# Patient Record
Sex: Female | Born: 1980
Health system: Southern US, Community
[De-identification: ages and names within clinical notes are randomized; demographics above are authoritative.]

## PROBLEM LIST (undated history)

## (undated) DIAGNOSIS — Z720 Tobacco use: Secondary | ICD-10-CM

## (undated) DIAGNOSIS — R911 Solitary pulmonary nodule: Secondary | ICD-10-CM

## (undated) DIAGNOSIS — F419 Anxiety disorder, unspecified: Secondary | ICD-10-CM

## (undated) DIAGNOSIS — F32A Depression, unspecified: Secondary | ICD-10-CM

## (undated) DIAGNOSIS — Z87442 Personal history of urinary calculi: Secondary | ICD-10-CM

## (undated) DIAGNOSIS — E785 Hyperlipidemia, unspecified: Secondary | ICD-10-CM

## (undated) DIAGNOSIS — E039 Hypothyroidism, unspecified: Secondary | ICD-10-CM

## (undated) DIAGNOSIS — F329 Major depressive disorder, single episode, unspecified: Secondary | ICD-10-CM

## (undated) DIAGNOSIS — J189 Pneumonia, unspecified organism: Secondary | ICD-10-CM

## (undated) HISTORY — PX: HAND SURGERY: SHX662

---
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
## 2000-01-25 ENCOUNTER — Other Ambulatory Visit: Admission: RE | Admit: 2000-01-25 | Discharge: 2000-01-25 | Payer: Self-pay | Admitting: Family Medicine

## 2000-03-17 ENCOUNTER — Emergency Department (HOSPITAL_COMMUNITY): Admission: EM | Admit: 2000-03-17 | Discharge: 2000-03-17 | Payer: Self-pay | Admitting: Emergency Medicine

## 2005-04-16 ENCOUNTER — Other Ambulatory Visit: Admission: RE | Admit: 2005-04-16 | Discharge: 2005-04-16 | Payer: Self-pay | Admitting: Obstetrics and Gynecology

## 2005-04-17 ENCOUNTER — Inpatient Hospital Stay (HOSPITAL_COMMUNITY): Admission: AD | Admit: 2005-04-17 | Discharge: 2005-04-17 | Payer: Self-pay | Admitting: Obstetrics & Gynecology

## 2005-11-12 ENCOUNTER — Encounter (INDEPENDENT_AMBULATORY_CARE_PROVIDER_SITE_OTHER): Payer: Self-pay | Admitting: Specialist

## 2005-11-12 ENCOUNTER — Inpatient Hospital Stay (HOSPITAL_COMMUNITY): Admission: RE | Admit: 2005-11-12 | Discharge: 2005-11-14 | Payer: Self-pay | Admitting: Obstetrics and Gynecology

## 2005-11-12 HISTORY — PX: TUBAL LIGATION: SHX77

## 2007-02-17 ENCOUNTER — Encounter: Admission: RE | Admit: 2007-02-17 | Discharge: 2007-02-17 | Payer: Self-pay | Admitting: *Deleted

## 2011-02-22 NOTE — Discharge Summary (Signed)
[name], Tasha Hoover             ACCOUNT NO.:  [id number]   MEDICAL RECORD NO.:  [id number]          PATIENT TYPE:  INP   LOCATION:  9122                          FACILITY:  [facility]   PHYSICIAN:  Randye Lobo, M.D.   DATE OF BIRTH:  [birth year]   DATE OF ADMISSION:  11/12/2005  DATE OF DISCHARGE:  11/14/2005                                 DISCHARGE SUMMARY   FINAL DIAGNOSES:  1.  Intrauterine pregnancy at term.  2.  History of previous cesarean section.  3.  Desires elective repeat cesarean section.   PROCEDURE:  Repeat low flap transverse cesarean section and tubal ligation  for permanent sterilization. Surgeon:  Dr. Miguel Aschoff. Assistant:  Dr. Lodema Hong. Complications:  None.   This 30 year old G2, P1-0-0-1 presents at [redacted] weeks gestation for repeat  cesarean section. The patient had a previous cesarean section and now elects  to repeat a cesarean section as well as a tubal sterilization. The patient's  antepartum course up to this point had been complicated just by her smoking,  which she was counseled on throughout her pregnancy. She did slow down but  never completely quit using cigarettes. The patient did have a negative  group B strep culture obtained in the office at 35 weeks. She was admitted  at this time. She was taken to the operating room by Dr. Miguel Aschoff where a  repeat low transverse cesarean section was performed with the delivery of a  6-pound 14-ounce female infant with Apgars of 9 and 9. There was a nuchal cord  x1. The procedure went without complications. The patient still expressed  her desires for permanent sterilization. This was performed without  complications. The patient's postoperative course was benign without any  significant fevers. She was felt ready for discharge on postoperative day  #2. She was sent home on a regular diet, told to decrease her activity, told  to continue her prenatal vitamins, was given Percocet one to two every 4  hours as  needed for pain, told she could use over-the-counter ibuprofen up  to 600 mg every 6 hours as needed for pain, was to follow up in the office  in 4 weeks.   LABORATORY ON DISCHARGE:  The patient had a hemoglobin of 11.8; white blood  cell count of 16.4; platelets of 279,000; and a normal chemistry panel.      Leilani Able, P.A.-C.      Randye Lobo, M.D.  Electronically Signed    MB/MEDQ  D:  12/15/2005  T:  12/16/2005  Job:  [id number]

## 2011-02-22 NOTE — Op Note (Signed)
Tasha Hoover, Tasha Hoover             ACCOUNT NO.:  [id number]   MEDICAL RECORD NO.:  [id number]          PATIENT TYPE:  INP   LOCATION:  9122                          FACILITY:  [facility]   PHYSICIAN:  Miguel Aschoff, M.D.       DATE OF BIRTH:  May 13, 1981   DATE OF PROCEDURE:  11/13/2005  DATE OF DISCHARGE:                                 OPERATIVE REPORT   PREOPERATIVE DIAGNOSES:  1.  Intrauterine pregnancy at term.  2.  Elective repeat cesarean section.   POSTOPERATIVE DIAGNOSES:  1.  Intrauterine pregnancy at term.  2.  Elective repeat cesarean section.  3.  Delivery of a viable female infant, Apgars 9 and 9.   OPERATION/PROCEDURE:  Repeat low flap transverse cesarean section.   SURGEON:  Miguel Aschoff, M.D.   ASSISTANT:  Luvenia Redden, M.D.   ANESTHESIA:  Spinal.   COMPLICATIONS:  None.   JUSTIFICATION:  The patient is a 30 year old white female, gravida 2, para 1-  0-0-1 at 31 weeks who had a previous cesarean section.  She now elected to  undergo elective repeat cesarean section as well as tubal sterilization.  The risks and benefits of the procedure were discussed with the patient and  informed consent has been obtained.   DESCRIPTION OF PROCEDURE:  The patient was taken to the operating room,  placed in a sitting position and spinal anesthesia was administered without  difficulty.  She was then placed in a supine position, deviated to the left  and prepped and draped in the usual sterile fashion.  A Foley catheter was  inserted.  After being prepped and draped, the Pfannenstiel incision was  made, extended down through the subcutaneous tissue with bleeding points  being clamped and coagulated as they were encountered.  The fascia was then  identified and incised transversely and separated from the underlying rectus  muscles.  Rectus muscles were divided in the midline.  The peritoneum was  then found and entered carefully avoiding underlying structures.  Peritoneal  incision was  extended under direct visualization.  At this point the bladder  flap was created and protected with a bladder blade.  An elliptical  transverse incision was made into the lower uterine segment.  The amniotic  cavity was entered.  Clear fluid was obtained and at this point the patient  was delivered of a viable female infant, Apgars 9 at one minute and 9 at five  minutes. The nuchal cord x1 was noted.  The baby was delivered from an LOA  position.  After delivery of the baby, cord bloods were obtained for  appropriate studies.  The placenta was delivered without difficulty.  The  uterus was then evacuated of any remaining products of conception.  Once  this was done, the angles of the uterine incision were ligated using figure-  of-eight sutures of #1 Vicryl.  The first layer was a running interlocking  suture of #1 Vicryl followed by an imbricating suture of #1 Vicryl.  Bladder  flap was reapproximated using running continuous 2-0 Vicryl suture.   With the patient requesting sterilization, attention was then  directed to  the left tube which was grasped with the Babcock clamp.  A knuckle of the  tube was thus created and this knuckle of tube was ligated with two  ligatures of 0 plain gut.  A portion of the tube without ligatures was  excised.  Tubal stumps were cauterized.  The identical procedure was then  carried out on the right side.   After this was done with adequate hemostasis, lap counts and instrument  counts were found to be correct.  The abdomen was irrigated with warm saline  and then the abdomen was closed.  Parietal peritoneum was closed using  running continuous  0 Vicryl suture.  Rectus muscles were reapproximated  using running continuous 0 Vicryl suture.  Fascia was closed using two  sutures of 0 Vicryl, each starting at the lateral fascial angles and meeting  in the midline. Subcutaneous tissue and skin were closed using staples.  The  estimated blood loss during the  procedure was approximately 500 mL.  The  patient tolerated the procedure well and went to the recovery room in  satisfactory condition.  The baby was taken to the nursery in satisfactory  condition.      Miguel Aschoff, M.D.  Electronically Signed     AR/MEDQ  D:  11/13/2005  T:  11/14/2005  Job:  [id number]

## 2013-11-24 ENCOUNTER — Other Ambulatory Visit: Payer: Self-pay | Admitting: Obstetrics & Gynecology

## 2013-11-24 DIAGNOSIS — N63 Unspecified lump in unspecified breast: Secondary | ICD-10-CM

## 2013-11-25 ENCOUNTER — Other Ambulatory Visit: Payer: Self-pay

## 2013-12-06 ENCOUNTER — Ambulatory Visit
Admission: RE | Admit: 2013-12-06 | Discharge: 2013-12-06 | Disposition: A | Discharge status: Home / Self Care | Payer: 59 | Source: Ambulatory Visit | Attending: Obstetrics & Gynecology | Admitting: Obstetrics & Gynecology

## 2013-12-06 DIAGNOSIS — N63 Unspecified lump in unspecified breast: Secondary | ICD-10-CM

## 2015-04-08 ENCOUNTER — Inpatient Hospital Stay (HOSPITAL_COMMUNITY)
Admission: EM | Admit: 2015-04-08 | Discharge: 2015-04-10 | DRG: 605 | Disposition: A | Discharge status: Home / Self Care | Payer: 59 | Attending: Internal Medicine | Admitting: Internal Medicine

## 2015-04-08 ENCOUNTER — Encounter (HOSPITAL_COMMUNITY): Payer: Self-pay | Admitting: Emergency Medicine

## 2015-04-08 ENCOUNTER — Emergency Department (HOSPITAL_COMMUNITY): Payer: 59

## 2015-04-08 DIAGNOSIS — S80212A Abrasion, left knee, initial encounter: Secondary | ICD-10-CM | POA: Diagnosis present

## 2015-04-08 DIAGNOSIS — S2232XA Fracture of one rib, left side, initial encounter for closed fracture: Secondary | ICD-10-CM | POA: Diagnosis not present

## 2015-04-08 DIAGNOSIS — S2242XA Multiple fractures of ribs, left side, initial encounter for closed fracture: Secondary | ICD-10-CM | POA: Diagnosis present

## 2015-04-08 DIAGNOSIS — S36030A Superficial (capsular) laceration of spleen, initial encounter: Secondary | ICD-10-CM | POA: Diagnosis present

## 2015-04-08 DIAGNOSIS — S0101XA Laceration without foreign body of scalp, initial encounter: Principal | ICD-10-CM | POA: Diagnosis present

## 2015-04-08 DIAGNOSIS — S9000XA Contusion of unspecified ankle, initial encounter: Secondary | ICD-10-CM | POA: Diagnosis present

## 2015-04-08 DIAGNOSIS — S3600XA Unspecified injury of spleen, initial encounter: Secondary | ICD-10-CM | POA: Diagnosis present

## 2015-04-08 DIAGNOSIS — D62 Acute posthemorrhagic anemia: Secondary | ICD-10-CM | POA: Diagnosis present

## 2015-04-08 DIAGNOSIS — S2249XA Multiple fractures of ribs, unspecified side, initial encounter for closed fracture: Secondary | ICD-10-CM | POA: Diagnosis present

## 2015-04-08 LAB — URINALYSIS, ROUTINE W REFLEX MICROSCOPIC
BILIRUBIN URINE: NEGATIVE
GLUCOSE, UA: NEGATIVE mg/dL
Ketones, ur: NEGATIVE mg/dL
LEUKOCYTES UA: NEGATIVE
NITRITE: NEGATIVE
PH: 6 (ref 5.0–8.0)
Protein, ur: 100 mg/dL — AB
Specific Gravity, Urine: 1.03 (ref 1.005–1.030)
Urobilinogen, UA: 0.2 mg/dL (ref 0.0–1.0)

## 2015-04-08 LAB — COMPREHENSIVE METABOLIC PANEL
ALT: 15 U/L (ref 14–54)
ANION GAP: 10 (ref 5–15)
AST: 25 U/L (ref 15–41)
Albumin: 3.9 g/dL (ref 3.5–5.0)
Alkaline Phosphatase: 42 U/L (ref 38–126)
BILIRUBIN TOTAL: 0.5 mg/dL (ref 0.3–1.2)
BUN: 13 mg/dL (ref 6–20)
CALCIUM: 9.3 mg/dL (ref 8.9–10.3)
CO2: 23 mmol/L (ref 22–32)
CREATININE: 1.19 mg/dL — AB (ref 0.44–1.00)
Chloride: 107 mmol/L (ref 101–111)
GFR calc Af Amer: >60 mL/min (ref >60)
GFR calc non Af Amer: 58 mL/min — ABNORMAL LOW (ref >60)
Glucose, Bld: 150 mg/dL — ABNORMAL HIGH (ref 65–99)
POTASSIUM: 3.4 mmol/L — AB (ref 3.5–5.1)
SODIUM: 140 mmol/L (ref 135–145)
TOTAL PROTEIN: 6.8 g/dL (ref 6.5–8.1)

## 2015-04-08 LAB — URINE MICROSCOPIC-ADD ON

## 2015-04-08 LAB — CBC WITH DIFFERENTIAL/PLATELET
Basophils Absolute: 0 10*3/uL (ref 0.0–0.1)
Basophils Relative: 0 % (ref 0–1)
EOS ABS: 0.4 10*3/uL (ref 0.0–0.7)
EOS PCT: 3 % (ref 0–5)
HEMATOCRIT: 40.7 % (ref 36.0–46.0)
Hemoglobin: 13.8 g/dL (ref 12.0–15.0)
Lymphocytes Relative: 42 % (ref 12–46)
Lymphs Abs: 5.2 10*3/uL — ABNORMAL HIGH (ref 0.7–4.0)
MCH: 33.1 pg (ref 26.0–34.0)
MCHC: 33.9 g/dL (ref 30.0–36.0)
MCV: 97.6 fL (ref 78.0–100.0)
MONOS PCT: 8 % (ref 3–12)
Monocytes Absolute: 1 10*3/uL (ref 0.1–1.0)
NEUTROS ABS: 5.7 10*3/uL (ref 1.7–7.7)
Neutrophils Relative %: 47 % (ref 43–77)
Platelets: 301 10*3/uL (ref 150–400)
RBC: 4.17 MIL/uL (ref 3.87–5.11)
RDW: 13.4 % (ref 11.5–15.5)
WBC: 12.3 10*3/uL — AB (ref 4.0–10.5)

## 2015-04-08 LAB — POC URINE PREG, ED: PREG TEST UR: NEGATIVE

## 2015-04-08 MED ORDER — IBUPROFEN 800 MG PO TABS
800.0000 mg | ORAL_TABLET | Freq: Once | ORAL | Status: AC
  Administered 2015-04-08: 800 mg via ORAL
  Filled 2015-04-08: qty 1
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
## 2015-04-08 NOTE — ED Notes (Signed)
Taken to xray at this time. 

## 2015-04-09 ENCOUNTER — Emergency Department (HOSPITAL_COMMUNITY): Payer: 59

## 2015-04-09 ENCOUNTER — Encounter (HOSPITAL_COMMUNITY): Payer: Self-pay | Admitting: Radiology

## 2015-04-09 DIAGNOSIS — D62 Acute posthemorrhagic anemia: Secondary | ICD-10-CM | POA: Diagnosis present

## 2015-04-09 DIAGNOSIS — S2232XA Fracture of one rib, left side, initial encounter for closed fracture: Secondary | ICD-10-CM | POA: Diagnosis present

## 2015-04-09 DIAGNOSIS — S3600XA Unspecified injury of spleen, initial encounter: Secondary | ICD-10-CM

## 2015-04-09 DIAGNOSIS — S36030A Superficial (capsular) laceration of spleen, initial encounter: Secondary | ICD-10-CM | POA: Diagnosis present

## 2015-04-09 DIAGNOSIS — S80212A Abrasion, left knee, initial encounter: Secondary | ICD-10-CM | POA: Diagnosis present

## 2015-04-09 DIAGNOSIS — S0101XA Laceration without foreign body of scalp, initial encounter: Secondary | ICD-10-CM | POA: Diagnosis present

## 2015-04-09 DIAGNOSIS — S2242XA Multiple fractures of ribs, left side, initial encounter for closed fracture: Secondary | ICD-10-CM | POA: Diagnosis present

## 2015-04-09 HISTORY — DX: Unspecified injury of spleen, initial encounter: S36.00XA

## 2015-04-09 LAB — CBC
HCT: 33.3 % — ABNORMAL LOW (ref 36.0–46.0)
HEMATOCRIT: 33.4 % — AB (ref 36.0–46.0)
HEMOGLOBIN: 11.6 g/dL — AB (ref 12.0–15.0)
Hemoglobin: 11.5 g/dL — ABNORMAL LOW (ref 12.0–15.0)
MCH: 33 pg (ref 26.0–34.0)
MCH: 33.6 pg (ref 26.0–34.0)
MCHC: 34.7 g/dL (ref 30.0–36.0)
MCHC: 34.5 g/dL (ref 30.0–36.0)
MCV: 94.9 fL (ref 78.0–100.0)
MCV: 97.4 fL (ref 78.0–100.0)
PLATELETS: UNDETERMINED 10*3/uL (ref 150–400)
Platelets: 148 10*3/uL — ABNORMAL LOW (ref 150–400)
RBC: 3.52 MIL/uL — ABNORMAL LOW (ref 3.87–5.11)
RBC: 3.42 MIL/uL — ABNORMAL LOW (ref 3.87–5.11)
RDW: 13.6 % (ref 11.5–15.5)
RDW: 13.7 % (ref 11.5–15.5)
WBC: 8.4 10*3/uL (ref 4.0–10.5)
WBC: 8.2 10*3/uL (ref 4.0–10.5)

## 2015-04-09 LAB — BASIC METABOLIC PANEL
ANION GAP: 7 (ref 5–15)
BUN: 9 mg/dL (ref 6–20)
CALCIUM: 8.2 mg/dL — AB (ref 8.9–10.3)
CHLORIDE: 115 mmol/L — AB (ref 101–111)
CO2: 16 mmol/L — AB (ref 22–32)
CREATININE: 0.71 mg/dL (ref 0.44–1.00)
GFR calc Af Amer: >60 mL/min (ref >60)
GFR calc non Af Amer: >60 mL/min (ref >60)
GLUCOSE: 83 mg/dL (ref 65–99)
POTASSIUM: 5 mmol/L (ref 3.5–5.1)
Sodium: 138 mmol/L (ref 135–145)

## 2015-04-09 MED ORDER — IOHEXOL 300 MG/ML  SOLN
100.0000 mL | Freq: Once | INTRAMUSCULAR | Status: AC | PRN
  Administered 2015-04-09: 100 mL via INTRAVENOUS

## 2015-04-09 MED ORDER — OXYCODONE HCL 5 MG PO TABS
10.0000 mg | ORAL_TABLET | ORAL | Status: DC | PRN
  Administered 2015-04-09 – 2015-04-10 (×5): 10 mg via ORAL
  Filled 2015-04-09 (×5): qty 2

## 2015-04-09 MED ORDER — TRAMADOL HCL 50 MG PO TABS
100.0000 mg | ORAL_TABLET | Freq: Two times a day (BID) | ORAL | Status: DC | PRN
  Administered 2015-04-10: 100 mg via ORAL
  Filled 2015-04-09: qty 2

## 2015-04-09 MED ORDER — PANTOPRAZOLE SODIUM 40 MG PO TBEC
40.0000 mg | DELAYED_RELEASE_TABLET | Freq: Every day | ORAL | Status: DC
  Administered 2015-04-10: 40 mg via ORAL
  Filled 2015-04-09: qty 1

## 2015-04-09 MED ORDER — CETYLPYRIDINIUM CHLORIDE 0.05 % MT LIQD
7.0000 mL | Freq: Two times a day (BID) | OROMUCOSAL | Status: DC
  Administered 2015-04-09 – 2015-04-10 (×2): 7 mL via OROMUCOSAL

## 2015-04-09 MED ORDER — ONDANSETRON HCL 4 MG/2ML IJ SOLN: 4.0000 mg | Freq: Four times a day (QID) | INTRAMUSCULAR | Status: DC | PRN

## 2015-04-09 MED ORDER — MORPHINE SULFATE 2 MG/ML IJ SOLN
2.0000 mg | Freq: Once | INTRAMUSCULAR | Status: AC
  Administered 2015-04-09: 2 mg via INTRAVENOUS
  Filled 2015-04-09: qty 1

## 2015-04-09 MED ORDER — IOHEXOL 300 MG/ML  SOLN
75.0000 mL | Freq: Once | INTRAMUSCULAR | Status: AC | PRN
  Administered 2015-04-09: 75 mL via INTRAVENOUS

## 2015-04-09 MED ORDER — NORETHIN ACE-ETH ESTRAD-FE 1.5-30 MG-MCG PO TABS: 1.0000 | ORAL_TABLET | Freq: Every day | ORAL | Status: DC

## 2015-04-09 MED ORDER — SODIUM CHLORIDE 0.9 % IV SOLN
INTRAVENOUS | Status: DC
  Administered 2015-04-09 (×2): via INTRAVENOUS

## 2015-04-09 MED ORDER — HYDROMORPHONE HCL 1 MG/ML IJ SOLN
1.0000 mg | INTRAMUSCULAR | Status: DC | PRN
  Administered 2015-04-09 (×2): 1 mg via INTRAVENOUS
  Filled 2015-04-09 (×3): qty 1

## 2015-04-09 MED ORDER — WHITE PETROLATUM GEL
Status: AC
  Administered 2015-04-09: 1
  Filled 2015-04-09: qty 1

## 2015-04-09 MED ORDER — SODIUM CHLORIDE 0.9 % IV BOLUS (SEPSIS)
1000.0000 mL | Freq: Once | INTRAVENOUS | Status: AC
  Administered 2015-04-09: 1000 mL via INTRAVENOUS

## 2015-04-09 MED ORDER — PANTOPRAZOLE SODIUM 40 MG IV SOLR
40.0000 mg | Freq: Every day | INTRAVENOUS | Status: DC
  Administered 2015-04-09: 40 mg via INTRAVENOUS
  Filled 2015-04-09: qty 40

## 2015-04-09 MED ORDER — ONDANSETRON HCL 4 MG PO TABS: 4.0000 mg | ORAL_TABLET | Freq: Four times a day (QID) | ORAL | Status: DC | PRN
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
## 2015-04-10 DIAGNOSIS — S2249XA Multiple fractures of ribs, unspecified side, initial encounter for closed fracture: Secondary | ICD-10-CM | POA: Diagnosis present

## 2015-04-10 DIAGNOSIS — S80212A Abrasion, left knee, initial encounter: Secondary | ICD-10-CM | POA: Diagnosis present

## 2015-04-10 DIAGNOSIS — S9000XA Contusion of unspecified ankle, initial encounter: Secondary | ICD-10-CM | POA: Diagnosis present

## 2015-04-10 DIAGNOSIS — S0101XA Laceration without foreign body of scalp, initial encounter: Secondary | ICD-10-CM | POA: Diagnosis present

## 2015-04-10 LAB — BASIC METABOLIC PANEL
ANION GAP: 5 (ref 5–15)
CALCIUM: 7.9 mg/dL — AB (ref 8.9–10.3)
CHLORIDE: 110 mmol/L (ref 101–111)
CO2: 20 mmol/L — ABNORMAL LOW (ref 22–32)
CREATININE: 0.66 mg/dL (ref 0.44–1.00)
GFR calc Af Amer: >60 mL/min (ref >60)
GFR calc non Af Amer: >60 mL/min (ref >60)
Glucose, Bld: 108 mg/dL — ABNORMAL HIGH (ref 65–99)
Potassium: 3.8 mmol/L (ref 3.5–5.1)
Sodium: 135 mmol/L (ref 135–145)

## 2015-04-10 LAB — CBC
HEMATOCRIT: 34.1 % — AB (ref 36.0–46.0)
HEMOGLOBIN: 11.3 g/dL — AB (ref 12.0–15.0)
MCH: 32.9 pg (ref 26.0–34.0)
MCHC: 33.1 g/dL (ref 30.0–36.0)
MCV: 99.4 fL (ref 78.0–100.0)
Platelets: 160 10*3/uL (ref 150–400)
RBC: 3.43 MIL/uL — AB (ref 3.87–5.11)
RDW: 13.5 % (ref 11.5–15.5)
WBC: 7.6 10*3/uL (ref 4.0–10.5)

## 2015-04-10 MED ORDER — TRAMADOL HCL 50 MG PO TABS: 100.0000 mg | ORAL_TABLET | Freq: Two times a day (BID) | ORAL | Status: DC | PRN

## 2015-04-10 MED ORDER — DOCUSATE SODIUM 100 MG PO CAPS: 100.0000 mg | ORAL_CAPSULE | Freq: Two times a day (BID) | ORAL | Status: DC | PRN

## 2015-04-10 MED ORDER — POLYETHYLENE GLYCOL 3350 17 G PO PACK: 17.0000 g | PACK | Freq: Every day | ORAL | Status: DC | PRN

## 2015-04-10 MED ORDER — OXYCODONE HCL 10 MG PO TABS: 10.0000 mg | ORAL_TABLET | Freq: Four times a day (QID) | ORAL | Status: DC | PRN

## 2015-04-10 MED ORDER — DOCUSATE SODIUM 100 MG PO CAPS
100.0000 mg | ORAL_CAPSULE | Freq: Two times a day (BID) | ORAL | Status: DC
  Administered 2015-04-10: 100 mg via ORAL
  Filled 2015-04-10: qty 1

## 2015-04-10 MED ORDER — POLYETHYLENE GLYCOL 3350 17 G PO PACK
17.0000 g | PACK | Freq: Every day | ORAL | Status: DC | PRN
  Administered 2015-04-10: 17 g via ORAL
  Filled 2015-04-10: qty 1
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
## 2015-04-10 NOTE — Progress Notes (Signed)
Orthopedic Tech Progress Note Patient Details:  Tasha Antiguaamela Hoover [birth year] [id number]  Ortho Devices Type of Ortho Device: Crutches Ortho Device/Splint Interventions: Application   Tasha Hoover 04/10/2015, 2:06 PM

## 2015-04-10 NOTE — Progress Notes (Signed)
Orthopedic Tech Progress Note Patient Details:  Tasha Antiguaamela Yanni Oct 19, 1980 [id number]  Patient ID: Tasha Hoover, female   DOB: Oct 19, 1980, 34 y.o.   MRN: [id number] Viewed order from doctor's order list  [name], [name] 04/10/2015, 2:06 PM
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
## 2015-04-10 NOTE — Discharge Instructions (Signed)
ESTABLISH CARE WITH A PRIMARY CARE PROVIDER AND CONSIDER GETTING REPEAT RIGHT FOOT XRAYS IF NOT IMPROVING.  MAY NEED REFERRAL FROM PRIMARY DOCTOR TO ORTHO. WEIGHT BEARING AS TOLERATED ON RIGHT FOOT, USE ICE, ELEVATION, COMPRESSION WITH ACE WRAP  NO HIGH IMPACT EXERCISE OR CONTACT SPORTS TO PREVENT FURTHER DAMAGE TO YOUR SPLEEN  Splenic Injury A splenic injury is an injury to the spleen. The spleen is an organ located in the upper left side of the abdomen, just under the ribs. The spleen filters and cleans the blood. It also stores blood cells and destroys cells that are worn out. The spleen is involved in fighting disease. However, when the spleen is removed, your body can still fight most diseases without it.  CAUSES  A blow to the abdomen can injure the spleen. In some cases, the spleen may only be bruised with some bleeding inside the covering and around the spleen. However, sometimes an injury to the spleen causes it to break (rupture). Illness can also cause the spleen to become enlarged and rupture. Because the spleen has a lot of blood going to it, a rupture is very serious and can be life-threatening. SYMPTOMS  Often, a minor splenic injury causes no symptoms or only minor abdominal pain. When bleeding from the injury is severe, the patient's blood pressure may rapidly decrease. This may cause some of the following problems:  Dizziness or lightheadedness.  Rapid heart rate.  Abdominal tenderness and swelling.  Fainting.  Sweating with clammy skin. DIAGNOSIS  Your caregiver may immediately know what is wrong by taking a history and doing a physical exam. If there is time, the diagnosis is often confirmed by a CT scan. Other imaging tests may also be done, such as an ultrasound exam or sometimes an MRI scan. Lab tests may be done to check your blood and may be needed frequently for a couple days after the injury.  TREATMENT   If the blood pressure is too low, emergency surgery may be  needed.  When injuries are less severe, your surgeon may decide to observe the injury, or to treat the injury with interventional radiology.Interventional radiology uses flexible tubes (catheters) to stop the bleeding from inside the blood vessel. It can only be used under certain circumstances. Your caregiver will tell you if this is an option.  One to several days in an intensive care unit (ICU) may be required. Fluid and blood levels will be monitored closely.Intravenous (IV) fluids and blood transfusions are sometimes needed. Follow-up scans may be used to check how the spleen is healing. The spleen may be able to heal itself. However, if the problems are getting worse, surgery may still be needed. HOME CARE INSTRUCTIONS  Keep all follow-up appointments as instructed by your caregiver. Even when the spleen appears to have healed well, your surgeon may want to continue following up on the injury.  Ask your caregiver if you need any immunizations. You may need certain immunizations depending on whether you had surgery, interventional radiology, or some other treatment for your spleen injury. SEEK IMMEDIATE MEDICAL CARE IF:  You have a fever.  Your abdominal pain gets worse.  You develop signs of infection, such as chills and feeling unwell. MAKE SURE YOU:  Understand these instructions.  Will watch your condition.  Will get help right away if you are not doing well or get worse. Document Released: 07/15/2006 Document Revised: 12/16/2011 Document Reviewed: 03/29/2011 [facility] Patient Information 2015 [place], [place]. This information is not intended to replace  advice given to you by your health care provider. Make sure you discuss any questions you have with your health care provider.   Contusion A contusion is a deep bruise. Contusions are the result of an injury that caused bleeding under the skin. The contusion may turn blue, purple, or yellow. Minor injuries will give you a  painless contusion, but more severe contusions may stay painful and swollen for a few weeks.  CAUSES  A contusion is usually caused by a blow, trauma, or direct force to an area of the body. SYMPTOMS   Swelling and redness of the injured area.  Bruising of the injured area.  Tenderness and soreness of the injured area.  Pain. DIAGNOSIS  The diagnosis can be made by taking a history and physical exam. An X-ray, CT scan, or MRI may be needed to determine if there were any associated injuries, such as fractures. TREATMENT  Specific treatment will depend on what area of the body was injured. In general, the best treatment for a contusion is resting, icing, elevating, and applying cold compresses to the injured area. Over-the-counter medicines may also be recommended for pain control. Ask your caregiver what the best treatment is for your contusion. HOME CARE INSTRUCTIONS   Put ice on the injured area.  Put ice in a plastic bag.  Place a towel between your skin and the bag.  Leave the ice on for 15-20 minutes, 3-4 times a day, or as directed by your health care provider.  Only take over-the-counter or prescription medicines for pain, discomfort, or fever as directed by your caregiver. Your caregiver may recommend avoiding anti-inflammatory medicines (aspirin, ibuprofen, and naproxen) for 48 hours because these medicines may increase bruising.  Rest the injured area.  If possible, elevate the injured area to reduce swelling. SEEK IMMEDIATE MEDICAL CARE IF:   You have increased bruising or swelling.  You have pain that is getting worse.  Your swelling or pain is not relieved with medicines. MAKE SURE YOU:   Understand these instructions.  Will watch your condition.  Will get help right away if you are not doing well or get worse. Document Released: 07/03/2005 Document Revised: 09/28/2013 Document Reviewed: 07/29/2011 [facility] Patient Information 2015 [place]. This  information is not intended to replace advice given to you by your health care provider. Make sure you discuss any questions you have with your health care provider.

## 2015-04-10 NOTE — Progress Notes (Signed)
Discussed discharge summary with patient. Reviewed all medications with patient. Patient received work note and Rx. Ace wrap applied to patient right ankle. Patient ready for discharge once cleared by PT.

## 2015-04-17 ENCOUNTER — Telehealth (HOSPITAL_COMMUNITY): Payer: Self-pay

## 2015-04-18 NOTE — Telephone Encounter (Signed)
Apparently her PCP told her to f/u with us but she is improving and feels like she's doing well so I reiterated that she only needed to see us prn. She is already seeing ortho for her foot pain.

## 2015-04-21 ENCOUNTER — Other Ambulatory Visit: Payer: Self-pay | Admitting: Family Medicine

## 2015-04-21 ENCOUNTER — Ambulatory Visit
Admission: RE | Admit: 2015-04-21 | Discharge: 2015-04-21 | Disposition: A | Discharge status: Home / Self Care | Payer: 59 | Source: Ambulatory Visit | Attending: Family Medicine | Admitting: Family Medicine

## 2015-04-21 DIAGNOSIS — S2232XA Fracture of one rib, left side, initial encounter for closed fracture: Secondary | ICD-10-CM

## 2015-04-21 DIAGNOSIS — R0789 Other chest pain: Secondary | ICD-10-CM

## 2016-11-12 DIAGNOSIS — G479 Sleep disorder, unspecified: Secondary | ICD-10-CM | POA: Diagnosis not present

## 2017-01-07 DIAGNOSIS — J019 Acute sinusitis, unspecified: Secondary | ICD-10-CM | POA: Diagnosis not present

## 2017-01-07 DIAGNOSIS — R05 Cough: Secondary | ICD-10-CM | POA: Diagnosis not present

## 2017-03-06 DIAGNOSIS — G4700 Insomnia, unspecified: Secondary | ICD-10-CM | POA: Diagnosis not present

## 2017-04-06 DIAGNOSIS — K047 Periapical abscess without sinus: Secondary | ICD-10-CM | POA: Diagnosis not present

## 2017-04-14 DIAGNOSIS — N390 Urinary tract infection, site not specified: Secondary | ICD-10-CM | POA: Diagnosis not present

## 2017-04-14 DIAGNOSIS — N200 Calculus of kidney: Secondary | ICD-10-CM | POA: Diagnosis not present

## 2017-04-22 DIAGNOSIS — G4700 Insomnia, unspecified: Secondary | ICD-10-CM | POA: Diagnosis not present

## 2017-04-24 DIAGNOSIS — G4709 Other insomnia: Secondary | ICD-10-CM | POA: Diagnosis not present

## 2017-04-24 DIAGNOSIS — G4733 Obstructive sleep apnea (adult) (pediatric): Secondary | ICD-10-CM | POA: Diagnosis not present

## 2017-04-24 DIAGNOSIS — R51 Headache: Secondary | ICD-10-CM | POA: Diagnosis not present

## 2017-05-07 DIAGNOSIS — G43909 Migraine, unspecified, not intractable, without status migrainosus: Secondary | ICD-10-CM | POA: Diagnosis not present

## 2017-05-07 DIAGNOSIS — K047 Periapical abscess without sinus: Secondary | ICD-10-CM | POA: Diagnosis not present

## 2017-05-07 DIAGNOSIS — G4709 Other insomnia: Secondary | ICD-10-CM | POA: Diagnosis not present

## 2017-05-12 DIAGNOSIS — S91209A Unspecified open wound of unspecified toe(s) with damage to nail, initial encounter: Secondary | ICD-10-CM | POA: Diagnosis not present

## 2017-05-28 DIAGNOSIS — M546 Pain in thoracic spine: Secondary | ICD-10-CM | POA: Diagnosis not present

## 2017-05-28 DIAGNOSIS — Z6823 Body mass index (BMI) 23.0-23.9, adult: Secondary | ICD-10-CM | POA: Diagnosis not present

## 2017-05-29 ENCOUNTER — Emergency Department (HOSPITAL_COMMUNITY)
Admission: EM | Admit: 2017-05-29 | Discharge: 2017-05-29 | Disposition: A | Discharge status: Home / Self Care | Payer: 59 | Attending: Emergency Medicine | Admitting: Emergency Medicine

## 2017-05-29 ENCOUNTER — Encounter (HOSPITAL_COMMUNITY): Payer: Self-pay

## 2017-05-29 ENCOUNTER — Emergency Department (HOSPITAL_COMMUNITY): Payer: 59

## 2017-05-29 DIAGNOSIS — M545 Low back pain, unspecified: Secondary | ICD-10-CM

## 2017-05-29 DIAGNOSIS — Z79899 Other long term (current) drug therapy: Secondary | ICD-10-CM | POA: Insufficient documentation

## 2017-05-29 DIAGNOSIS — W07XXXA Fall from chair, initial encounter: Secondary | ICD-10-CM | POA: Diagnosis not present

## 2017-05-29 DIAGNOSIS — S3992XA Unspecified injury of lower back, initial encounter: Secondary | ICD-10-CM | POA: Diagnosis not present

## 2017-05-29 LAB — URINALYSIS, ROUTINE W REFLEX MICROSCOPIC
Bilirubin Urine: NEGATIVE
Glucose, UA: NEGATIVE mg/dL
Ketones, ur: NEGATIVE mg/dL
Nitrite: NEGATIVE
PROTEIN: NEGATIVE mg/dL
Specific Gravity, Urine: 1.006 (ref 1.005–1.030)
pH: 6 (ref 5.0–8.0)

## 2017-05-29 MED ORDER — TRAMADOL HCL 50 MG PO TABS: 50.0000 mg | ORAL_TABLET | Freq: Four times a day (QID) | ORAL | 0 refills | Status: DC | PRN

## 2017-05-29 MED ORDER — TRAMADOL HCL 50 MG PO TABS
50.0000 mg | ORAL_TABLET | Freq: Once | ORAL | Status: AC
  Administered 2017-05-29: 50 mg via ORAL
  Filled 2017-05-29: qty 1
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
## 2017-05-29 NOTE — ED Notes (Signed)
Pt returning from Xray.  

## 2017-05-29 NOTE — Discharge Instructions (Signed)
Please read attached information. If you experience any new or worsening signs or symptoms please return to the emergency room for evaluation. Please follow-up with your primary care provider or specialist as discussed. Please use medication prescribed only as directed and discontinue taking if you have any concerning signs or symptoms.   °

## 2017-05-29 NOTE — ED Notes (Signed)
Patient transported to X-ray 

## 2017-05-29 NOTE — ED Notes (Signed)
Pt reports not taking muscle relaxer or anti-inflammatory today. Reports she was dizzy. She also says she has not eaten or drank anything today. Pt is alert and oriented. Not distress noted.

## 2017-05-29 NOTE — ED Triage Notes (Signed)
Pt presents for evaluation of lower back pain since Sunday. Pt reports she was standing on chair attempting to put object on top shelf when she fell. Pt reports saw PCP Monday and given anti inflammatories and muscle relaxers with no improvement. Pt is ambulatory.

## 2017-05-29 NOTE — ED Notes (Signed)
ED Provider at bedside. 

## 2017-05-29 NOTE — ED Notes (Signed)
Pt verbalized understanding of discharge instructions and denies any further questions at this time.   

## 2017-05-30 LAB — URINE CULTURE

## 2017-06-05 ENCOUNTER — Other Ambulatory Visit: Payer: Self-pay | Admitting: Internal Medicine

## 2017-06-05 DIAGNOSIS — R220 Localized swelling, mass and lump, head: Secondary | ICD-10-CM

## 2017-06-05 DIAGNOSIS — R3129 Other microscopic hematuria: Secondary | ICD-10-CM | POA: Diagnosis not present

## 2017-06-05 DIAGNOSIS — M546 Pain in thoracic spine: Secondary | ICD-10-CM | POA: Diagnosis not present

## 2017-06-05 DIAGNOSIS — R071 Chest pain on breathing: Secondary | ICD-10-CM | POA: Diagnosis not present

## 2017-06-10 DIAGNOSIS — Z6823 Body mass index (BMI) 23.0-23.9, adult: Secondary | ICD-10-CM | POA: Diagnosis not present

## 2017-06-10 DIAGNOSIS — R0789 Other chest pain: Secondary | ICD-10-CM | POA: Diagnosis not present

## 2017-06-11 ENCOUNTER — Ambulatory Visit
Admission: RE | Admit: 2017-06-11 | Discharge: 2017-06-11 | Disposition: A | Discharge status: Home / Self Care | Payer: 59 | Source: Ambulatory Visit | Attending: Internal Medicine | Admitting: Internal Medicine

## 2017-06-11 DIAGNOSIS — R221 Localized swelling, mass and lump, neck: Secondary | ICD-10-CM | POA: Diagnosis not present

## 2017-06-11 DIAGNOSIS — R220 Localized swelling, mass and lump, head: Secondary | ICD-10-CM

## 2017-06-12 ENCOUNTER — Other Ambulatory Visit: Payer: 59

## 2017-06-24 DIAGNOSIS — Z124 Encounter for screening for malignant neoplasm of cervix: Secondary | ICD-10-CM | POA: Diagnosis not present

## 2017-06-24 DIAGNOSIS — R300 Dysuria: Secondary | ICD-10-CM | POA: Diagnosis not present

## 2017-06-24 DIAGNOSIS — Z01419 Encounter for gynecological examination (general) (routine) without abnormal findings: Secondary | ICD-10-CM | POA: Diagnosis not present

## 2017-06-30 DIAGNOSIS — Z049 Encounter for examination and observation for unspecified reason: Secondary | ICD-10-CM | POA: Diagnosis not present

## 2017-06-30 DIAGNOSIS — G43839 Menstrual migraine, intractable, without status migrainosus: Secondary | ICD-10-CM | POA: Diagnosis not present

## 2017-06-30 DIAGNOSIS — R51 Headache: Secondary | ICD-10-CM | POA: Diagnosis not present

## 2017-06-30 DIAGNOSIS — G43719 Chronic migraine without aura, intractable, without status migrainosus: Secondary | ICD-10-CM | POA: Diagnosis not present

## 2017-06-30 DIAGNOSIS — Z79899 Other long term (current) drug therapy: Secondary | ICD-10-CM | POA: Diagnosis not present

## 2018-01-19 DIAGNOSIS — N971 Female infertility of tubal origin: Secondary | ICD-10-CM | POA: Insufficient documentation

## 2018-01-19 DIAGNOSIS — F419 Anxiety disorder, unspecified: Secondary | ICD-10-CM | POA: Insufficient documentation

## 2018-02-02 DIAGNOSIS — Z6824 Body mass index (BMI) 24.0-24.9, adult: Secondary | ICD-10-CM | POA: Diagnosis not present

## 2018-02-02 DIAGNOSIS — G4700 Insomnia, unspecified: Secondary | ICD-10-CM | POA: Diagnosis not present

## 2018-02-19 IMAGING — DX DG LUMBAR SPINE COMPLETE 4+V
5 series · 5 of 5 positions shown · non-contrast
Comparison: None.

CLINICAL DATA: Pain following fall

EXAM:
LUMBAR SPINE - COMPLETE 4+ VIEW

[t lumbar spine ap]
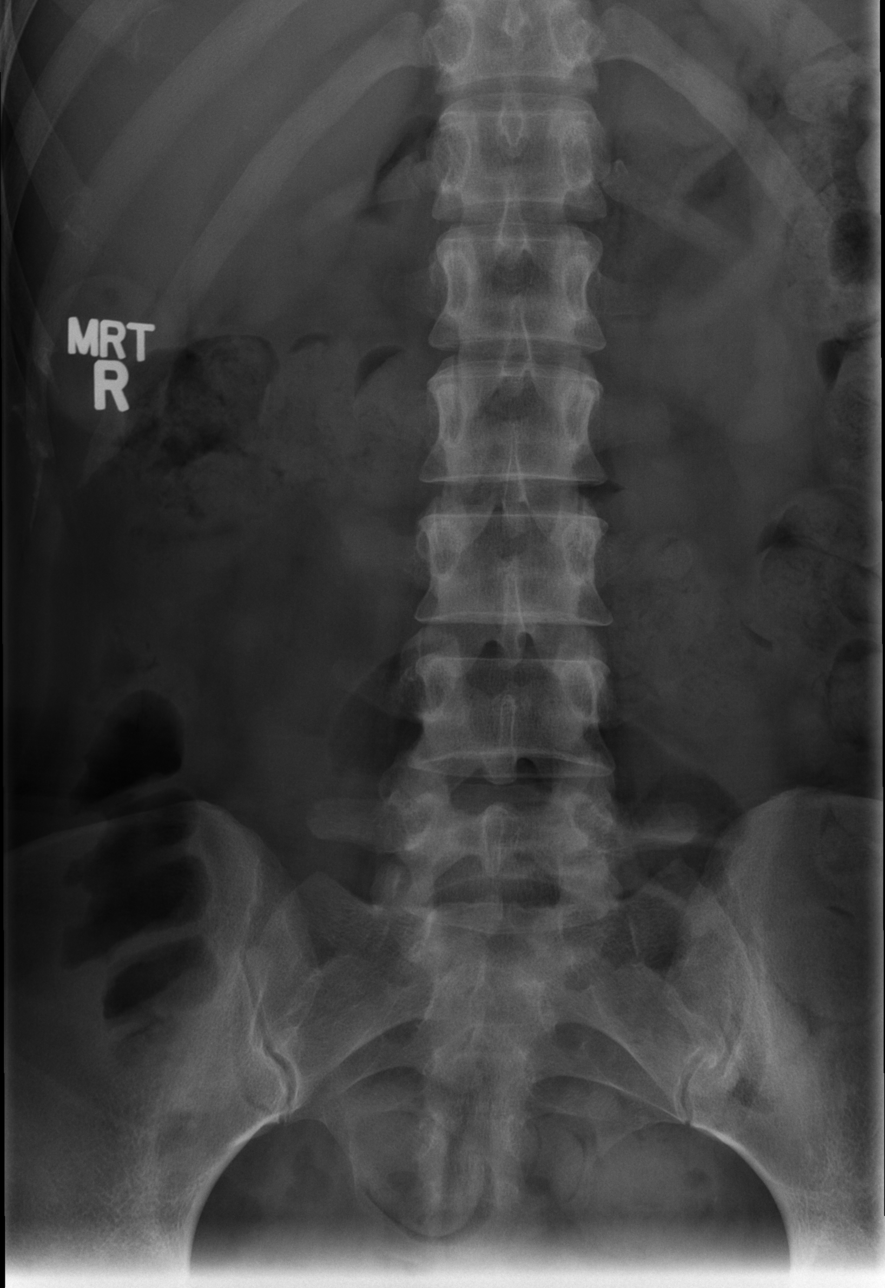

[t lumbar spine obl (1 of 2)]
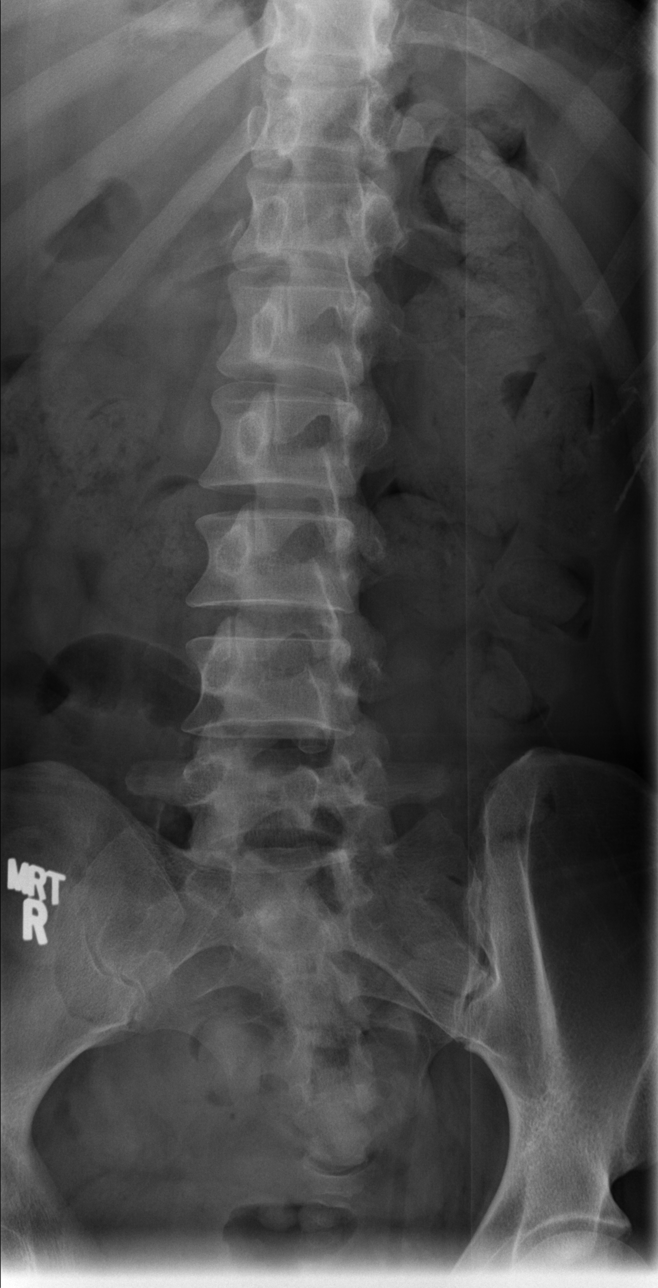

[t lumbar spine obl (2 of 2)]
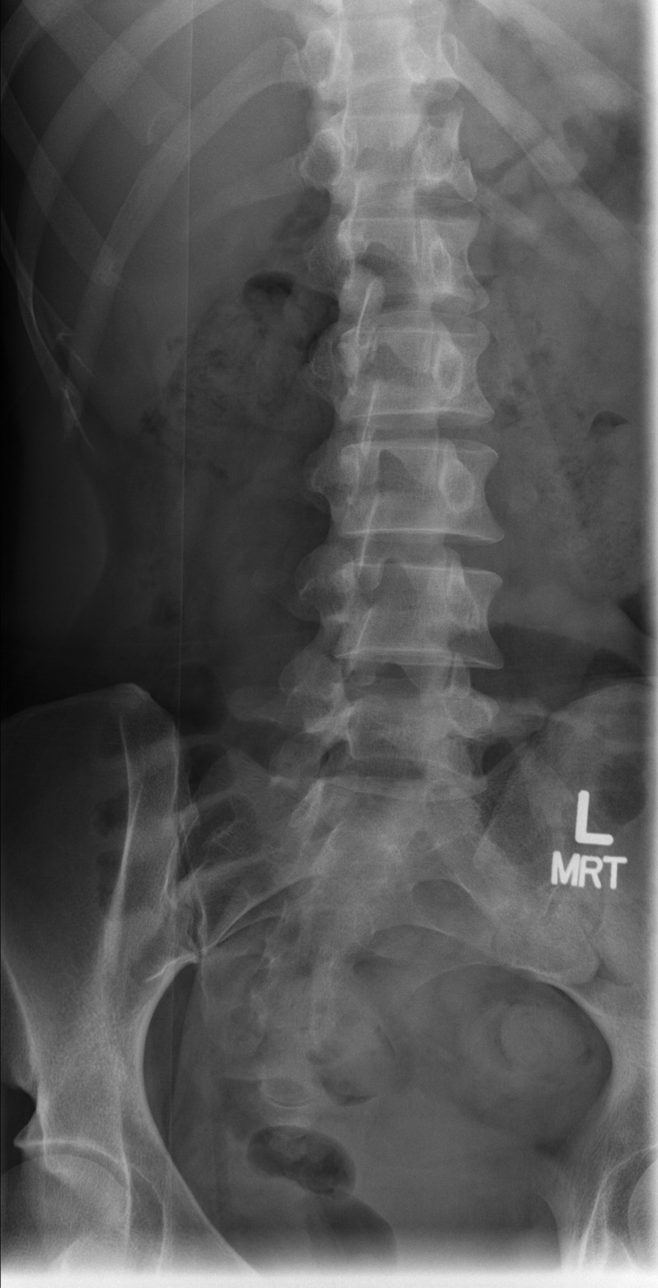

[t lumbar spine lat]
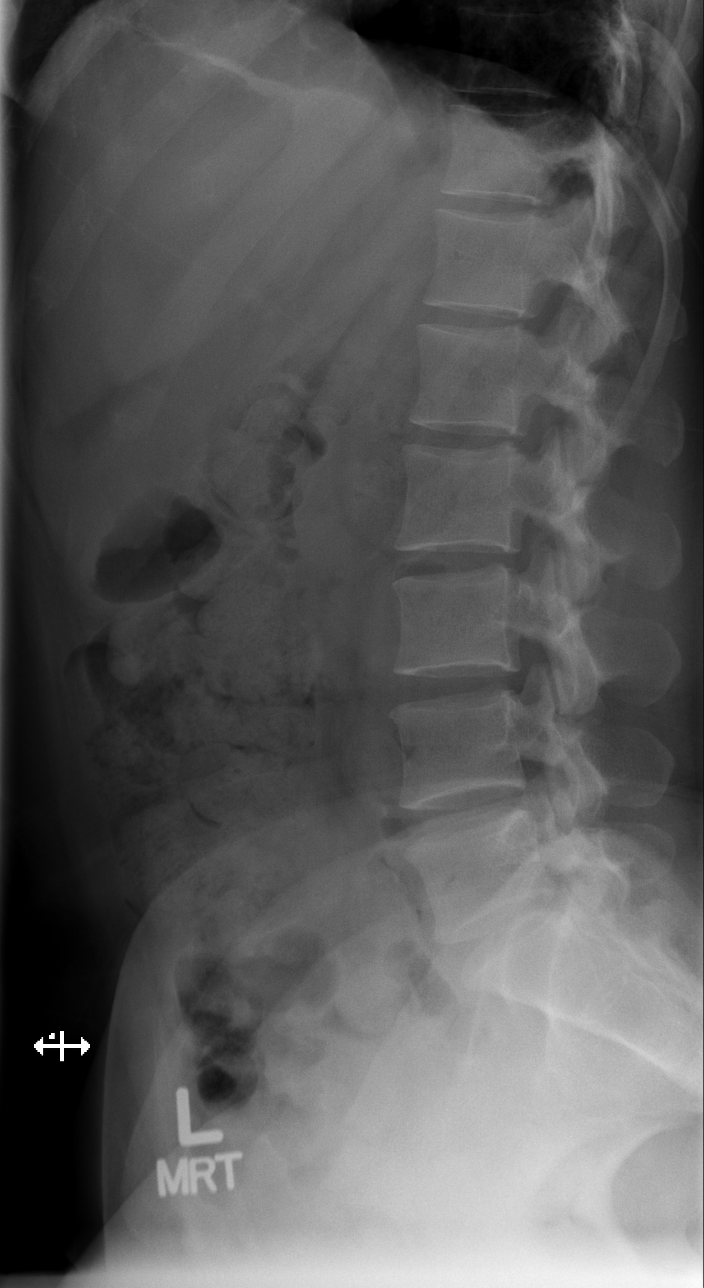

[t lumbar l-5 s-1 spot]
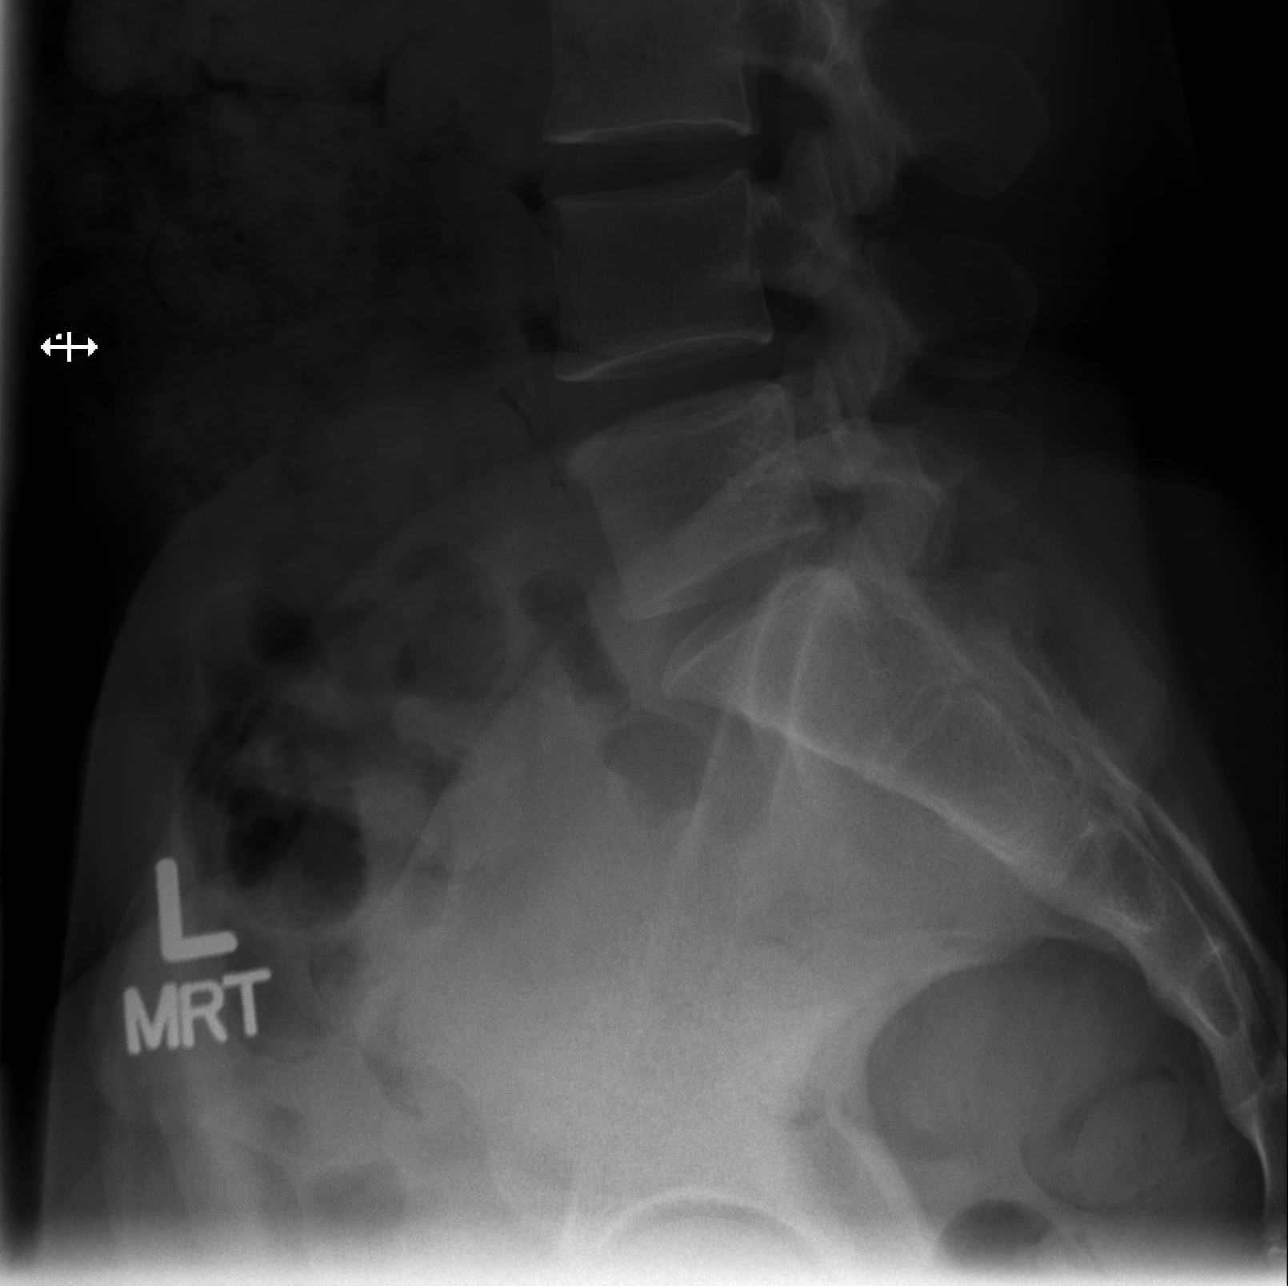

[5 of 5 positions shown; findings below may reference images not displayed]

FINDINGS: Frontal, lateral, spot lumbosacral lateral, and bilateral oblique
views were obtained. There are 5 non-rib-bearing lumbar type
vertebral bodies. T12 ribs are somewhat hypoplastic. There is no
fracture or spondylolisthesis. Disc spaces appear normal. There is
no appreciable facet arthropathy.
IMPRESSION: No fracture or spondylolisthesis.  No appreciable arthropathy.

## 2018-03-13 DIAGNOSIS — G4700 Insomnia, unspecified: Secondary | ICD-10-CM | POA: Diagnosis not present

## 2018-04-11 DIAGNOSIS — H52223 Regular astigmatism, bilateral: Secondary | ICD-10-CM | POA: Diagnosis not present

## 2018-04-11 DIAGNOSIS — H5203 Hypermetropia, bilateral: Secondary | ICD-10-CM | POA: Diagnosis not present

## 2018-05-04 DIAGNOSIS — Z0000 Encounter for general adult medical examination without abnormal findings: Secondary | ICD-10-CM | POA: Diagnosis not present

## 2018-05-07 DIAGNOSIS — Z6824 Body mass index (BMI) 24.0-24.9, adult: Secondary | ICD-10-CM | POA: Diagnosis not present

## 2018-05-07 DIAGNOSIS — Z0000 Encounter for general adult medical examination without abnormal findings: Secondary | ICD-10-CM | POA: Diagnosis not present

## 2018-05-07 DIAGNOSIS — G4700 Insomnia, unspecified: Secondary | ICD-10-CM | POA: Diagnosis not present

## 2018-05-07 DIAGNOSIS — Z23 Encounter for immunization: Secondary | ICD-10-CM | POA: Diagnosis not present

## 2018-05-18 DIAGNOSIS — R5383 Other fatigue: Secondary | ICD-10-CM | POA: Diagnosis not present

## 2018-05-18 DIAGNOSIS — K5900 Constipation, unspecified: Secondary | ICD-10-CM | POA: Diagnosis not present

## 2018-05-18 DIAGNOSIS — E039 Hypothyroidism, unspecified: Secondary | ICD-10-CM | POA: Diagnosis not present

## 2018-06-09 DIAGNOSIS — J069 Acute upper respiratory infection, unspecified: Secondary | ICD-10-CM | POA: Diagnosis not present

## 2018-06-09 DIAGNOSIS — Z3202 Encounter for pregnancy test, result negative: Secondary | ICD-10-CM | POA: Diagnosis not present

## 2018-06-09 DIAGNOSIS — R102 Pelvic and perineal pain: Secondary | ICD-10-CM | POA: Diagnosis not present

## 2018-06-11 DIAGNOSIS — Z23 Encounter for immunization: Secondary | ICD-10-CM | POA: Diagnosis not present

## 2018-07-15 DIAGNOSIS — E039 Hypothyroidism, unspecified: Secondary | ICD-10-CM | POA: Diagnosis not present

## 2018-07-15 DIAGNOSIS — Z6827 Body mass index (BMI) 27.0-27.9, adult: Secondary | ICD-10-CM | POA: Diagnosis not present

## 2018-07-15 DIAGNOSIS — J42 Unspecified chronic bronchitis: Secondary | ICD-10-CM | POA: Diagnosis not present

## 2018-07-15 DIAGNOSIS — J069 Acute upper respiratory infection, unspecified: Secondary | ICD-10-CM | POA: Diagnosis not present

## 2018-07-16 DIAGNOSIS — E039 Hypothyroidism, unspecified: Secondary | ICD-10-CM | POA: Diagnosis not present

## 2018-08-03 ENCOUNTER — Emergency Department (HOSPITAL_COMMUNITY): Payer: 59

## 2018-08-03 ENCOUNTER — Other Ambulatory Visit: Payer: Self-pay

## 2018-08-03 ENCOUNTER — Encounter (HOSPITAL_COMMUNITY): Payer: Self-pay | Admitting: Emergency Medicine

## 2018-08-03 ENCOUNTER — Observation Stay (HOSPITAL_COMMUNITY)
Admission: EM | Admit: 2018-08-03 | Discharge: 2018-08-05 | Disposition: A | Discharge status: Home / Self Care | Payer: 59 | Attending: Internal Medicine | Admitting: Internal Medicine

## 2018-08-03 DIAGNOSIS — Z881 Allergy status to other antibiotic agents status: Secondary | ICD-10-CM | POA: Insufficient documentation

## 2018-08-03 DIAGNOSIS — Z720 Tobacco use: Secondary | ICD-10-CM | POA: Diagnosis present

## 2018-08-03 DIAGNOSIS — A419 Sepsis, unspecified organism: Secondary | ICD-10-CM

## 2018-08-03 DIAGNOSIS — R6510 Systemic inflammatory response syndrome (SIRS) of non-infectious origin without acute organ dysfunction: Principal | ICD-10-CM | POA: Diagnosis present

## 2018-08-03 DIAGNOSIS — R911 Solitary pulmonary nodule: Secondary | ICD-10-CM | POA: Diagnosis not present

## 2018-08-03 DIAGNOSIS — F17200 Nicotine dependence, unspecified, uncomplicated: Secondary | ICD-10-CM | POA: Diagnosis not present

## 2018-08-03 DIAGNOSIS — R079 Chest pain, unspecified: Secondary | ICD-10-CM | POA: Diagnosis not present

## 2018-08-03 DIAGNOSIS — J069 Acute upper respiratory infection, unspecified: Secondary | ICD-10-CM | POA: Diagnosis not present

## 2018-08-03 DIAGNOSIS — Z79899 Other long term (current) drug therapy: Secondary | ICD-10-CM | POA: Diagnosis not present

## 2018-08-03 DIAGNOSIS — E039 Hypothyroidism, unspecified: Secondary | ICD-10-CM | POA: Diagnosis present

## 2018-08-03 DIAGNOSIS — I071 Rheumatic tricuspid insufficiency: Secondary | ICD-10-CM | POA: Diagnosis not present

## 2018-08-03 DIAGNOSIS — R0602 Shortness of breath: Secondary | ICD-10-CM | POA: Diagnosis not present

## 2018-08-03 DIAGNOSIS — F17210 Nicotine dependence, cigarettes, uncomplicated: Secondary | ICD-10-CM | POA: Diagnosis not present

## 2018-08-03 DIAGNOSIS — J189 Pneumonia, unspecified organism: Secondary | ICD-10-CM | POA: Diagnosis present

## 2018-08-03 DIAGNOSIS — E785 Hyperlipidemia, unspecified: Secondary | ICD-10-CM | POA: Diagnosis not present

## 2018-08-03 DIAGNOSIS — Z8249 Family history of ischemic heart disease and other diseases of the circulatory system: Secondary | ICD-10-CM | POA: Insufficient documentation

## 2018-08-03 DIAGNOSIS — I959 Hypotension, unspecified: Secondary | ICD-10-CM | POA: Diagnosis not present

## 2018-08-03 DIAGNOSIS — J40 Bronchitis, not specified as acute or chronic: Secondary | ICD-10-CM | POA: Diagnosis not present

## 2018-08-03 DIAGNOSIS — Z7951 Long term (current) use of inhaled steroids: Secondary | ICD-10-CM | POA: Diagnosis not present

## 2018-08-03 DIAGNOSIS — J42 Unspecified chronic bronchitis: Secondary | ICD-10-CM | POA: Diagnosis not present

## 2018-08-03 HISTORY — DX: Depression, unspecified: F32.A

## 2018-08-03 HISTORY — DX: Major depressive disorder, single episode, unspecified: F32.9

## 2018-08-03 HISTORY — DX: Hyperlipidemia, unspecified: E78.5

## 2018-08-03 HISTORY — DX: Hypothyroidism, unspecified: E03.9

## 2018-08-03 HISTORY — DX: Pneumonia, unspecified organism: J18.9

## 2018-08-03 HISTORY — DX: Tobacco use: Z72.0

## 2018-08-03 HISTORY — DX: Personal history of urinary calculi: Z87.442

## 2018-08-03 HISTORY — DX: Solitary pulmonary nodule: R91.1

## 2018-08-03 LAB — CBC WITH DIFFERENTIAL/PLATELET
ABS IMMATURE GRANULOCYTES: 0.11 10*3/uL — AB (ref 0.00–0.07)
BASOS ABS: 0.1 10*3/uL (ref 0.0–0.1)
BASOS PCT: 1 %
Eosinophils Absolute: 0.2 10*3/uL (ref 0.0–0.5)
Eosinophils Relative: 3 %
HCT: 44 % (ref 36.0–46.0)
Hemoglobin: 14.6 g/dL (ref 12.0–15.0)
IMMATURE GRANULOCYTES: 1 %
Lymphocytes Relative: 44 %
Lymphs Abs: 4.4 10*3/uL — ABNORMAL HIGH (ref 0.7–4.0)
MCH: 32.7 pg (ref 26.0–34.0)
MCHC: 33.2 g/dL (ref 30.0–36.0)
MCV: 98.4 fL (ref 80.0–100.0)
Monocytes Absolute: 0.6 10*3/uL (ref 0.1–1.0)
Monocytes Relative: 6 %
NEUTROS ABS: 4.4 10*3/uL (ref 1.7–7.7)
NEUTROS PCT: 45 %
PLATELETS: 336 10*3/uL (ref 150–400)
RBC: 4.47 MIL/uL (ref 3.87–5.11)
RDW: 13.9 % (ref 11.5–15.5)
WBC: 9.8 10*3/uL (ref 4.0–10.5)
nRBC: 0 % (ref 0.0–0.2)

## 2018-08-03 LAB — URINALYSIS, ROUTINE W REFLEX MICROSCOPIC
BACTERIA UA: NONE SEEN
Bilirubin Urine: NEGATIVE
GLUCOSE, UA: NEGATIVE mg/dL
KETONES UR: NEGATIVE mg/dL
Leukocytes, UA: NEGATIVE
Nitrite: NEGATIVE
PH: 6 (ref 5.0–8.0)
Protein, ur: NEGATIVE mg/dL
SPECIFIC GRAVITY, URINE: 1.002 — AB (ref 1.005–1.030)

## 2018-08-03 LAB — COMPREHENSIVE METABOLIC PANEL
ALT: 12 U/L (ref 0–44)
ANION GAP: 10 (ref 5–15)
AST: 26 U/L (ref 15–41)
Albumin: 4 g/dL (ref 3.5–5.0)
Alkaline Phosphatase: 52 U/L (ref 38–126)
BILIRUBIN TOTAL: 0.9 mg/dL (ref 0.3–1.2)
BUN: 14 mg/dL (ref 6–20)
CHLORIDE: 109 mmol/L (ref 98–111)
CO2: 19 mmol/L — ABNORMAL LOW (ref 22–32)
Calcium: 8.9 mg/dL (ref 8.9–10.3)
Creatinine, Ser: 1.02 mg/dL — ABNORMAL HIGH (ref 0.44–1.00)
GFR calc Af Amer: >60 mL/min (ref >60)
Glucose, Bld: 94 mg/dL (ref 70–99)
POTASSIUM: 4.1 mmol/L (ref 3.5–5.1)
Sodium: 138 mmol/L (ref 135–145)
TOTAL PROTEIN: 7.2 g/dL (ref 6.5–8.1)

## 2018-08-03 LAB — I-STAT BETA HCG BLOOD, ED (MC, WL, AP ONLY): I-stat hCG, quantitative: <5 m[IU]/mL (ref <5)

## 2018-08-03 LAB — I-STAT CG4 LACTIC ACID, ED: LACTIC ACID, VENOUS: 2.33 mmol/L — AB (ref 0.5–1.9)

## 2018-08-03 LAB — RAPID URINE DRUG SCREEN, HOSP PERFORMED
AMPHETAMINES: NOT DETECTED
BARBITURATES: NOT DETECTED
Benzodiazepines: NOT DETECTED
Cocaine: NOT DETECTED
Opiates: NOT DETECTED
Tetrahydrocannabinol: NOT DETECTED

## 2018-08-03 LAB — I-STAT TROPONIN, ED: TROPONIN I, POC: 0 ng/mL (ref 0.00–0.08)

## 2018-08-03 MED ORDER — SODIUM CHLORIDE 0.9 % IV SOLN
2.0000 g | Freq: Once | INTRAVENOUS | Status: AC
  Administered 2018-08-03: 2 g via INTRAVENOUS
  Filled 2018-08-03: qty 2

## 2018-08-03 MED ORDER — SODIUM CHLORIDE 0.9 % IV BOLUS (SEPSIS)
1000.0000 mL | Freq: Once | INTRAVENOUS | Status: AC
  Administered 2018-08-03: 1000 mL via INTRAVENOUS

## 2018-08-03 MED ORDER — SODIUM CHLORIDE 0.9 % IV BOLUS (SEPSIS)
250.0000 mL | Freq: Once | INTRAVENOUS | Status: AC
  Administered 2018-08-03: 250 mL via INTRAVENOUS

## 2018-08-03 MED ORDER — VANCOMYCIN HCL IN DEXTROSE 1-5 GM/200ML-% IV SOLN
1000.0000 mg | Freq: Once | INTRAVENOUS | Status: AC
  Administered 2018-08-03: 1000 mg via INTRAVENOUS
  Filled 2018-08-03: qty 200

## 2018-08-03 MED ORDER — VANCOMYCIN HCL IN DEXTROSE 750-5 MG/150ML-% IV SOLN: 750.0000 mg | Freq: Two times a day (BID) | INTRAVENOUS | Status: DC

## 2018-08-03 MED ORDER — DIPHENHYDRAMINE HCL 50 MG/ML IJ SOLN
25.0000 mg | Freq: Once | INTRAMUSCULAR | Status: AC
  Administered 2018-08-04: 25 mg via INTRAVENOUS
  Filled 2018-08-03: qty 1

## 2018-08-03 MED ORDER — SODIUM CHLORIDE 0.9 % IV SOLN
1.0000 g | Freq: Three times a day (TID) | INTRAVENOUS | Status: DC
  Filled 2018-08-03: qty 1

## 2018-08-03 MED ORDER — SODIUM CHLORIDE 0.9 % IV BOLUS (SEPSIS)
500.0000 mL | Freq: Once | INTRAVENOUS | Status: AC
  Administered 2018-08-03: 500 mL via INTRAVENOUS

## 2018-08-03 MED ORDER — METRONIDAZOLE IN NACL 5-0.79 MG/ML-% IV SOLN
500.0000 mg | Freq: Three times a day (TID) | INTRAVENOUS | Status: DC
  Administered 2018-08-03: 500 mg via INTRAVENOUS
  Filled 2018-08-03: qty 100

## 2018-08-03 NOTE — ED Provider Notes (Signed)
One month of bronchitic symptoms:  SOB, prod cough, ST, aches No N, V, D, fever Tachy to 130's on arrival Hypotensive, some vague confusion No murmur, no fever Getting sepsis work up  Plan: likely admission  Re-evaluate: patient remains awake and alert. Boyfriend at bedside - denies any confusion or disorientation. No gross behavior changes. VS improving with less tachycardia, better blood pressure. Will not need ICU/SDU. Hospitalist paged.  Dr. Toniann Fail accepts the patient for admission.   Elpidio Anis, PA-C 08/03/18 2322    Vanetta Mulders, MD 08/11/18 [phone]

## 2018-08-03 NOTE — ED Triage Notes (Signed)
Patient with shortness of breath and chest pain.  Patient has been treated for bronchitis recently.  Patient with central chest pain, sharp in nature.  No nausea or vomiting at this time.
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
## 2018-08-03 NOTE — ED Notes (Signed)
I-Stat Lactic Acid results of 2.33 reported to Dr.Zackowski.

## 2018-08-03 NOTE — Progress Notes (Addendum)
Pharmacy Antibiotic Note  Tasha Hoover is a 38 y.o. female recently treated for bronchitis admitted on 08/03/2018 with SOB and chest pain.  CXR negative for cardiopulmonary process.  Pharmacy has been consulted for vancomycin and cefepime dosing for sepsis.  She is on Flagyl.   Plan: Vanc 1gm IV x 1 Cefepime 2gm IV x 1 Flagyl 500mg  IV Q8H per MD F/U baseline labs and height/weight for further dosing     Temp (24hrs), Avg:97.6 F (36.4 C), Min:97.6 F (36.4 C), Max:97.6 F (36.4 C)  No results for input(s): WBC, CREATININE, LATICACIDVEN, VANCOTROUGH, VANCOPEAK, VANCORANDOM, GENTTROUGH, GENTPEAK, GENTRANDOM, TOBRATROUGH, TOBRAPEAK, TOBRARND, AMIKACINPEAK, AMIKACINTROU, AMIKACIN in the last 168 hours.  CrCl cannot be calculated (Patient's most recent lab result is older than the maximum 21 days allowed.).    No Known Allergies   Vanc 10/28 >> Cefepime 10/28 >>  10/28 UCx -  10/28 BCx -   Tasha Hoover D. Laney Potash, PharmD, BCPS, BCCCP 08/03/2018, 9:53 PM  ==============================  Addendum: SCr 1.02, CrCL 68 ml/min, LA 2.33 5'2" and 150 lbs per patient  Plan: Vanc 1gm IV x 1, then 750mg  IV Q12H Cefepime 2gm IV x 1, then 1gm IV Q8H Flagyl 500mg  IV Q8H per MD Monitor renal fxn, clinical progress, vanc trough at Css   Tasha Hoover D. Laney Potash, PharmD, BCPS, BCCCP 08/03/2018, 10:36 PM

## 2018-08-03 NOTE — ED Notes (Signed)
Pt returned from xray

## 2018-08-04 ENCOUNTER — Other Ambulatory Visit: Payer: Self-pay

## 2018-08-04 ENCOUNTER — Observation Stay (HOSPITAL_BASED_OUTPATIENT_CLINIC_OR_DEPARTMENT_OTHER): Payer: 59

## 2018-08-04 ENCOUNTER — Observation Stay (HOSPITAL_COMMUNITY): Payer: 59

## 2018-08-04 ENCOUNTER — Encounter (HOSPITAL_COMMUNITY): Payer: Self-pay | Admitting: Internal Medicine

## 2018-08-04 DIAGNOSIS — R6510 Systemic inflammatory response syndrome (SIRS) of non-infectious origin without acute organ dysfunction: Secondary | ICD-10-CM

## 2018-08-04 DIAGNOSIS — R0602 Shortness of breath: Secondary | ICD-10-CM | POA: Diagnosis not present

## 2018-08-04 DIAGNOSIS — E039 Hypothyroidism, unspecified: Secondary | ICD-10-CM | POA: Diagnosis present

## 2018-08-04 DIAGNOSIS — I361 Nonrheumatic tricuspid (valve) insufficiency: Secondary | ICD-10-CM

## 2018-08-04 DIAGNOSIS — R062 Wheezing: Secondary | ICD-10-CM

## 2018-08-04 DIAGNOSIS — E785 Hyperlipidemia, unspecified: Secondary | ICD-10-CM | POA: Diagnosis not present

## 2018-08-04 LAB — D-DIMER, QUANTITATIVE (NOT AT ARMC)

## 2018-08-04 LAB — COMPREHENSIVE METABOLIC PANEL
ALT: 12 U/L (ref 0–44)
AST: 15 U/L (ref 15–41)
Albumin: 3.2 g/dL — ABNORMAL LOW (ref 3.5–5.0)
Alkaline Phosphatase: 39 U/L (ref 38–126)
Anion gap: 7 (ref 5–15)
BUN: 12 mg/dL (ref 6–20)
CALCIUM: 7.5 mg/dL — AB (ref 8.9–10.3)
CO2: 20 mmol/L — ABNORMAL LOW (ref 22–32)
CREATININE: 0.85 mg/dL (ref 0.44–1.00)
Chloride: 113 mmol/L — ABNORMAL HIGH (ref 98–111)
GFR calc Af Amer: >60 mL/min (ref >60)
Glucose, Bld: 81 mg/dL (ref 70–99)
Potassium: 4.1 mmol/L (ref 3.5–5.1)
Sodium: 140 mmol/L (ref 135–145)
Total Bilirubin: 0.3 mg/dL (ref 0.3–1.2)
Total Protein: 5.5 g/dL — ABNORMAL LOW (ref 6.5–8.1)

## 2018-08-04 LAB — ECHOCARDIOGRAM COMPLETE
HEIGHTINCHES: 63 in
WEIGHTICAEL: 2560 [oz_av]

## 2018-08-04 LAB — STREP PNEUMONIAE URINARY ANTIGEN: STREP PNEUMO URINARY ANTIGEN: NEGATIVE

## 2018-08-04 LAB — CBC WITH DIFFERENTIAL/PLATELET
Abs Immature Granulocytes: 0.05 10*3/uL (ref 0.00–0.07)
Basophils Absolute: 0 10*3/uL (ref 0.0–0.1)
Basophils Relative: 1 %
EOS ABS: 0.2 10*3/uL (ref 0.0–0.5)
Eosinophils Relative: 3 %
HEMATOCRIT: 37.1 % (ref 36.0–46.0)
Hemoglobin: 12.2 g/dL (ref 12.0–15.0)
IMMATURE GRANULOCYTES: 1 %
Lymphocytes Relative: 46 %
Lymphs Abs: 3.1 10*3/uL (ref 0.7–4.0)
MCH: 32.4 pg (ref 26.0–34.0)
MCHC: 32.9 g/dL (ref 30.0–36.0)
MCV: 98.7 fL (ref 80.0–100.0)
MONO ABS: 0.5 10*3/uL (ref 0.1–1.0)
MONOS PCT: 8 %
NEUTROS PCT: 41 %
Neutro Abs: 2.7 10*3/uL (ref 1.7–7.7)
Platelets: 291 10*3/uL (ref 150–400)
RBC: 3.76 MIL/uL — ABNORMAL LOW (ref 3.87–5.11)
RDW: 14.1 % (ref 11.5–15.5)
WBC: 6.5 10*3/uL (ref 4.0–10.5)
nRBC: 0 % (ref 0.0–0.2)

## 2018-08-04 LAB — PROCALCITONIN: Procalcitonin: <0.1 ng/mL

## 2018-08-04 LAB — TROPONIN I: Troponin I: <0.03 ng/mL (ref <0.03)

## 2018-08-04 LAB — LACTIC ACID, PLASMA: LACTIC ACID, VENOUS: 1.5 mmol/L (ref 0.5–1.9)

## 2018-08-04 LAB — I-STAT CG4 LACTIC ACID, ED: LACTIC ACID, VENOUS: 2.24 mmol/L — AB (ref 0.5–1.9)

## 2018-08-04 LAB — PROTIME-INR
INR: 1.03
Prothrombin Time: 13.4 seconds (ref 11.4–15.2)

## 2018-08-04 LAB — HIV ANTIBODY (ROUTINE TESTING W REFLEX): HIV Screen 4th Generation wRfx: NONREACTIVE

## 2018-08-04 LAB — APTT: aPTT: 30 seconds (ref 24–36)

## 2018-08-04 LAB — TSH: TSH: 1.833 u[IU]/mL (ref 0.350–4.500)

## 2018-08-04 MED ORDER — ALPRAZOLAM 0.5 MG PO TABS: 1.0000 mg | ORAL_TABLET | Freq: Two times a day (BID) | ORAL | Status: DC | PRN

## 2018-08-04 MED ORDER — ALBUTEROL (5 MG/ML) CONTINUOUS INHALATION SOLN: 2.5000 mg | INHALATION_SOLUTION | Freq: Four times a day (QID) | RESPIRATORY_TRACT | Status: DC | PRN

## 2018-08-04 MED ORDER — LEVALBUTEROL HCL 0.63 MG/3ML IN NEBU: 0.6300 mg | INHALATION_SOLUTION | Freq: Four times a day (QID) | RESPIRATORY_TRACT | Status: DC | PRN

## 2018-08-04 MED ORDER — ACETAMINOPHEN 650 MG RE SUPP: 650.0000 mg | Freq: Four times a day (QID) | RECTAL | Status: DC | PRN

## 2018-08-04 MED ORDER — ONDANSETRON HCL 4 MG PO TABS: 4.0000 mg | ORAL_TABLET | Freq: Four times a day (QID) | ORAL | Status: DC | PRN

## 2018-08-04 MED ORDER — SODIUM CHLORIDE 0.9 % IV SOLN
INTRAVENOUS | Status: DC
  Administered 2018-08-04 (×2): via INTRAVENOUS

## 2018-08-04 MED ORDER — ENOXAPARIN SODIUM 40 MG/0.4ML ~~LOC~~ SOLN
40.0000 mg | Freq: Every day | SUBCUTANEOUS | Status: DC
  Administered 2018-08-04 – 2018-08-05 (×2): 40 mg via SUBCUTANEOUS
  Filled 2018-08-04 (×2): qty 0.4

## 2018-08-04 MED ORDER — SODIUM CHLORIDE 0.9 % IV BOLUS
1000.0000 mL | Freq: Once | INTRAVENOUS | Status: AC
  Administered 2018-08-04: 1000 mL via INTRAVENOUS

## 2018-08-04 MED ORDER — ONDANSETRON HCL 4 MG/2ML IJ SOLN: 4.0000 mg | Freq: Four times a day (QID) | INTRAMUSCULAR | Status: DC | PRN

## 2018-08-04 MED ORDER — SODIUM CHLORIDE 0.9 % IV SOLN
100.0000 mg | Freq: Two times a day (BID) | INTRAVENOUS | Status: DC
  Filled 2018-08-04: qty 100

## 2018-08-04 MED ORDER — ACETAMINOPHEN 325 MG PO TABS: 650.0000 mg | ORAL_TABLET | Freq: Four times a day (QID) | ORAL | Status: DC | PRN

## 2018-08-04 MED ORDER — LEVOTHYROXINE SODIUM 50 MCG PO TABS
50.0000 ug | ORAL_TABLET | Freq: Every day | ORAL | Status: DC
  Administered 2018-08-04 – 2018-08-05 (×2): 50 ug via ORAL
  Filled 2018-08-04 (×2): qty 1

## 2018-08-04 MED ORDER — LEVOFLOXACIN IN D5W 750 MG/150ML IV SOLN
750.0000 mg | Freq: Every day | INTRAVENOUS | Status: DC
  Administered 2018-08-04 – 2018-08-05 (×2): 750 mg via INTRAVENOUS
  Filled 2018-08-04 (×2): qty 150

## 2018-08-04 MED ORDER — BENZONATATE 100 MG PO CAPS: 200.0000 mg | ORAL_CAPSULE | Freq: Three times a day (TID) | ORAL | Status: DC | PRN

## 2018-08-04 MED ORDER — BUPROPION HCL ER (SR) 100 MG PO TB12
200.0000 mg | ORAL_TABLET | Freq: Every day | ORAL | Status: DC
  Administered 2018-08-04 – 2018-08-05 (×2): 200 mg via ORAL
  Filled 2018-08-04 (×2): qty 2

## 2018-08-04 MED ORDER — TRAZODONE HCL 50 MG PO TABS: 150.0000 mg | ORAL_TABLET | Freq: Every day | ORAL | Status: DC

## 2018-08-04 MED ORDER — IOPAMIDOL (ISOVUE-370) INJECTION 76%
100.0000 mL | Freq: Once | INTRAVENOUS | Status: AC | PRN
  Administered 2018-08-04: 53 mL via INTRAVENOUS

## 2018-08-04 NOTE — ED Notes (Signed)
This RN in room with pt. Pt woke up as I walked into room. Pt's BP reading 70s/40s, pt was laying on her side. Pt moved to her back and BP rechecked automated, then read 50s/30s, manual BP taken in the right arm at 70/35, Pt axox4, gcs 15, has no complaints at this time. 1L Bolus started, Admitting MD notified and will come see pt.

## 2018-08-04 NOTE — ED Notes (Signed)
MD Toniann Fail notified on not being able to get IV access for CT angio.

## 2018-08-04 NOTE — ED Notes (Signed)
IV team unable to get IV access. 

## 2018-08-04 NOTE — ED Notes (Signed)
Admitting MD paged to Kevin RN @ [id number] @ 0942.  

## 2018-08-04 NOTE — Progress Notes (Signed)
Patient admitted after midnight, please see H&P.  BP and HR improving with IV abx. Patient is asymptomatic.   Suspect PNA as a source.  If Blood cultures negative and patient continues to improve, plan to narrow abx to CAP coverage in the AM.  Suspect home tomorrow if stable.   Marlin Canary DO

## 2018-08-04 NOTE — ED Notes (Signed)
Dr Benjamine Mola notified of pt's condition. Pt is resting in bed, axox4. BP 90s/60s and has remained in that range since 1L bolus.

## 2018-08-04 NOTE — ED Notes (Signed)
Attempted IV access for CT angio, without success. Ordering IV Team consult.

## 2018-08-04 NOTE — ED Notes (Signed)
Pt complains of right arm pain but states she is not in any respiratory distress.

## 2018-08-04 NOTE — H&P (Addendum)
History and Physical    STAPHANIE HARBISON ZOX:[id number] DOB: 1981-07-02 DOA: 08/03/2018  PCP: Lewis Moccasin, MD  Patient coming from: Home.  Chief Complaint: Shortness of breath.  HPI: Tasha Hoover is a 37 y.o. female with history of hypothyroidism and hyperlipidemia presents to the ER with complaints of shortness of breath.  Patient has been having shortness of breath.  Wheezing and cough for last almost 2 months.  Symptoms are mostly exertional.  And was treated by primary care physician with Z-Pak previously.  Patient also states that her symptoms initially started as sore throat and some of her family members also was treated for strep throat and had her strep throat results have come negative done by her primary care physician.  Had gone to her PCP again yesterday and was told that they going to refer her to the cardiologist.  But since patient symptoms was ongoing patient came to the ER.  ED Course: In the ER patient was initially hypotensive and tachycardic.  Lactate was elevated at 2.3.  Was given fluid bolus for possible sepsis and started on empiric antibiotics.  After starting vancomycin patient had mild rash at the IV site and it was discontinued and IV Benadryl was given.  Chest x-ray shows nothing acute.  UA is unremarkable.  CT angiogram of the chest has been ordered.  Admitted for further management.  Review of Systems: As per HPI, rest all negative.   Past Medical History:  Diagnosis Date  . Hyperlipidemia   . Hypothyroidism     History reviewed. No pertinent surgical history.   reports that she has been smoking. She has never used smokeless tobacco. She reports that she does not drink alcohol or use drugs.  Allergies  Allergen Reactions  . Vancomycin Hives    Family History  Problem Relation Age of Onset  . CAD Maternal Grandfather     Prior to Admission medications   Medication Sig Start Date End Date Taking? Authorizing Provider  albuterol  (PROVENTIL HFA;VENTOLIN HFA) 108 (90 Base) MCG/ACT inhaler Inhale 2 puffs into the lungs every 6 (six) hours as needed for wheezing or shortness of breath.   Yes [provider]  ALPRAZolam Prudy Feeler) 1 MG tablet Take 1 mg by mouth 2 (two) times daily as needed for anxiety.   Yes [provider]  benzonatate (TESSALON) 200 MG capsule Take 200 mg by mouth 3 (three) times daily as needed for cough.   Yes [provider]  buPROPion (WELLBUTRIN SR) 100 MG 12 hr tablet Take 200 mg by mouth daily.   Yes [provider]  ibuprofen (ADVIL,MOTRIN) 200 MG tablet Take 800 mg by mouth 2 (two) times daily as needed (pain).    Yes [provider]  levothyroxine (SYNTHROID, LEVOTHROID) 50 MCG tablet Take 50 mcg by mouth daily before breakfast.   Yes [provider]  norethindrone-ethinyl estradiol-iron (JUNEL FE 1.5/30) 1.5-30 MG-MCG tablet Take 1 tablet by mouth daily.   Yes [provider]  traZODone (DESYREL) 50 MG tablet Take 150 mg by mouth at bedtime.   Yes [provider]  docusate sodium (COLACE) 100 MG capsule Take 1 capsule (100 mg total) by mouth 2 (two) times daily as needed for mild constipation. Patient not taking: Reported on 08/03/2018 04/10/15   Nonie Hoyer, PA-C  oxyCODONE 10 MG TABS Take 1 tablet (10 mg total) by mouth every 6 (six) hours as needed for moderate pain. Patient not taking: Reported on 08/03/2018 04/10/15  Francoise Schaumann, Megan N, PA-C  polyethylene glycol (MIRALAX / GLYCOLAX) packet Take 17 g by mouth daily as needed for mild constipation or moderate constipation. Patient not taking: Reported on 08/03/2018 04/10/15   Nonie Hoyer, PA-C  traMADol (ULTRAM) 50 MG tablet Take 1 tablet (50 mg total) by mouth every 6 (six) hours as needed. Patient not taking: Reported on 08/03/2018 05/29/17   Eyvonne Mechanic, PA-C    Physical Exam: Vitals:   08/04/18 0030 08/04/18 0115 08/04/18 0126 08/04/18 0221  BP: 94/68 109/68 (!) 91/51  93/68  Pulse: 96 99 90 90  Resp: 20 18 16 17   Temp:      TempSrc:      SpO2: 99% 97% 99% 98%  Weight:      Height:          Constitutional: Moderately built and nourished. Vitals:   08/04/18 0030 08/04/18 0115 08/04/18 0126 08/04/18 0221  BP: 94/68 109/68 (!) 91/51 93/68  Pulse: 96 99 90 90  Resp: 20 18 16 17   Temp:      TempSrc:      SpO2: 99% 97% 99% 98%  Weight:      Height:       Eyes: Anicteric no pallor. ENMT: No discharge from the ears eyes nose or mouth. Neck: No mass felt.  No neck rigidity.  No JVD appreciated. Respiratory: No rhonchi or crepitations. Cardiovascular: S1-S2 heard no murmurs appreciated. Abdomen: Soft nontender bowel sounds present. Musculoskeletal: No edema.  No joint effusion. Skin: No rash. Neurologic: Alert awake oriented to time place and person.  Moves all extremities. Psychiatric: Appears normal per normal affect.   Labs on Admission: I have personally reviewed following labs and imaging studies  CBC: Recent Labs  Lab 08/03/18 2152  WBC 9.8  NEUTROABS 4.4  HGB 14.6  HCT 44.0  MCV 98.4  PLT 336   Basic Metabolic Panel: Recent Labs  Lab 08/03/18 2152  NA 138  K 4.1  CL 109  CO2 19*  GLUCOSE 94  BUN 14  CREATININE 1.02*  CALCIUM 8.9   GFR: Estimated Creatinine Clearance: 71.4 mL/min (A) (by C-G formula based on SCr of 1.02 mg/dL (H)). Liver Function Tests: Recent Labs  Lab 08/03/18 2152  AST 26  ALT 12  ALKPHOS 52  BILITOT 0.9  PROT 7.2  ALBUMIN 4.0   No results for input(s): LIPASE, AMYLASE in the last 168 hours. No results for input(s): AMMONIA in the last 168 hours. Coagulation Profile: No results for input(s): INR, PROTIME in the last 168 hours. Cardiac Enzymes: No results for input(s): CKTOTAL, CKMB, CKMBINDEX, TROPONINI in the last 168 hours. BNP (last 3 results) No results for input(s): PROBNP in the last 8760 hours. HbA1C: No results for input(s): HGBA1C in the last 72 hours. CBG: No results for  input(s): GLUCAP in the last 168 hours. Lipid Profile: No results for input(s): CHOL, HDL, LDLCALC, TRIG, CHOLHDL, LDLDIRECT in the last 72 hours. Thyroid Function Tests: No results for input(s): TSH, T4TOTAL, FREET4, T3FREE, THYROIDAB in the last 72 hours. Anemia Panel: No results for input(s): VITAMINB12, FOLATE, FERRITIN, TIBC, IRON, RETICCTPCT in the last 72 hours. Urine analysis:    Component Value Date/Time   COLORURINE COLORLESS (A) 08/03/2018 2050   APPEARANCEUR CLEAR 08/03/2018 2050   LABSPEC 1.002 (L) 08/03/2018 2050   PHURINE 6.0 08/03/2018 2050   GLUCOSEU NEGATIVE 08/03/2018 2050   HGBUR SMALL (A) 08/03/2018 2050   BILIRUBINUR NEGATIVE 08/03/2018 2050   KETONESUR NEGATIVE 08/03/2018 2050  PROTEINUR NEGATIVE 08/03/2018 2050   UROBILINOGEN 0.2 04/08/2015 2223   NITRITE NEGATIVE 08/03/2018 2050   LEUKOCYTESUR NEGATIVE 08/03/2018 2050   Sepsis Labs: @LABRCNTIP (procalcitonin:4,lacticidven:4) )No results found for this or any previous visit (from the past 240 hour(s)).   Radiological Exams on Admission: Dg Chest 2 View  Result Date: 08/03/2018 CLINICAL DATA:  Chest pain and shortness of breath 2 months. EXAM: CHEST - 2 VIEW COMPARISON:  None. FINDINGS: Lungs are adequately inflated without focal airspace consolidation or effusion. Cardiomediastinal silhouette is normal. Bones and soft tissues are normal. IMPRESSION: No active cardiopulmonary disease. Electronically Signed   By: Elberta Fortis M.D.   On: 08/03/2018 21:23    EKG: Independently reviewed.  Sinus tachycardia with short PR interval.  Assessment/Plan Principal Problem:   SIRS (systemic inflammatory response syndrome) (HCC) Active Problems:   Hypothyroidism   HLD (hyperlipidemia)    1. SIRS -source not clear.  Will follow blood cultures lactate level and procalcitonin level.  CT angiogram of the chest has been ordered to rule out PE.  If it does show pneumonia we will keep antibiotics directed for pneumonia  and will get further test on that.  Check 2D echo. 2. Hypothyroidism on Synthroid.  Check TSH. 3. Hyperlipidemia on statins.  Statin dose has to be verified. 4. Patient is on Wellbutrin and trazodone.   Note that patient's EKG was showing sinus tachycardia with short PR interval.  Once patient heart rate improves are ordered a repeat EKG and based on that we may need cardiology evaluation is required.  DVT prophylaxis: Lovenox. Code Status: Full code. Family Communication: Patient's mother at the bedside. Disposition Plan: Home. Consults called: None. Admission status: Observation.   Eduard Clos MD [facility] Pager [phone].  If 7PM-7AM, please contact night-coverage [email or web address] Password [email or web address]  08/04/2018, 2:28 AM

## 2018-08-04 NOTE — Progress Notes (Signed)
  Echocardiogram 2D Echocardiogram has been performed.  Leta Jungling M 08/04/2018, 9:51 AM

## 2018-08-05 ENCOUNTER — Encounter (HOSPITAL_COMMUNITY): Payer: Self-pay | Admitting: Internal Medicine

## 2018-08-05 DIAGNOSIS — R911 Solitary pulmonary nodule: Secondary | ICD-10-CM

## 2018-08-05 DIAGNOSIS — E785 Hyperlipidemia, unspecified: Secondary | ICD-10-CM

## 2018-08-05 DIAGNOSIS — J189 Pneumonia, unspecified organism: Secondary | ICD-10-CM | POA: Diagnosis not present

## 2018-08-05 DIAGNOSIS — E039 Hypothyroidism, unspecified: Secondary | ICD-10-CM

## 2018-08-05 DIAGNOSIS — Z720 Tobacco use: Secondary | ICD-10-CM | POA: Diagnosis not present

## 2018-08-05 DIAGNOSIS — R6510 Systemic inflammatory response syndrome (SIRS) of non-infectious origin without acute organ dysfunction: Secondary | ICD-10-CM | POA: Diagnosis not present

## 2018-08-05 LAB — URINE CULTURE: Culture: NO GROWTH

## 2018-08-05 LAB — BASIC METABOLIC PANEL
Anion gap: 12 (ref 5–15)
BUN: 9 mg/dL (ref 6–20)
CO2: 18 mmol/L — ABNORMAL LOW (ref 22–32)
Calcium: 8.5 mg/dL — ABNORMAL LOW (ref 8.9–10.3)
Chloride: 109 mmol/L (ref 98–111)
Creatinine, Ser: 0.81 mg/dL (ref 0.44–1.00)
GFR calc Af Amer: >60 mL/min (ref >60)
Glucose, Bld: 82 mg/dL (ref 70–99)
POTASSIUM: 3.9 mmol/L (ref 3.5–5.1)
SODIUM: 139 mmol/L (ref 135–145)

## 2018-08-05 LAB — CBC
HCT: 42 % (ref 36.0–46.0)
Hemoglobin: 13.3 g/dL (ref 12.0–15.0)
MCH: 31.7 pg (ref 26.0–34.0)
MCHC: 31.7 g/dL (ref 30.0–36.0)
MCV: 100.2 fL — ABNORMAL HIGH (ref 80.0–100.0)
PLATELETS: 288 10*3/uL (ref 150–400)
RBC: 4.19 MIL/uL (ref 3.87–5.11)
RDW: 13.8 % (ref 11.5–15.5)
WBC: 8.3 10*3/uL (ref 4.0–10.5)
nRBC: 0 % (ref 0.0–0.2)

## 2018-08-05 LAB — LEGIONELLA PNEUMOPHILA SEROGP 1 UR AG: L. PNEUMOPHILA SEROGP 1 UR AG: NEGATIVE

## 2018-08-05 MED ORDER — LEVOFLOXACIN 500 MG PO TABS: 500.0000 mg | ORAL_TABLET | Freq: Every day | ORAL | 0 refills | Status: AC

## 2018-08-05 MED ORDER — SODIUM CHLORIDE 0.9 % IV SOLN
INTRAVENOUS | Status: DC | PRN
  Administered 2018-08-05: 250 mL via INTRAVENOUS

## 2018-08-05 NOTE — Discharge Summary (Signed)
Physician Discharge Summary  Tasha Hoover ZOX:[id number] DOB: [birth year] DOA: 08/03/2018  PCP: Lewis Moccasin, MD  Admit date: 08/03/2018 Discharge date: 08/05/2018  Time spent: 40 minutes Recommendations for Outpatient Follow-up:  1. Follow up with PCP 2-3 weeks for evaluation of resolution of pneumonia    Discharge Diagnoses:  Principal Problem:   SIRS (systemic inflammatory response syndrome) (HCC) Active Problems:   CAP (community acquired pneumonia)   Pulmonary nodule   Tobacco use   Hypothyroidism   HLD (hyperlipidemia)   Discharge Condition: stable  Diet recommendation: regular  Filed Weights   08/03/18 2320 08/03/18 2320 08/05/18 0448  Weight: 72.6 kg 72.6 kg 72 kg    History of present illness:  Tasha Hoover is a 37 y.o. female with history of hypothyroidism and hyperlipidemia presented to the ER 10/29 with complaints of shortness of breath.  Patient had been having shortness of breath.  Wheezing and cough for last almost 2 months.  Symptoms are mostly exertional.  And was treated by primary care physician with Z-Pak previously.  Patient also stated that her symptoms initially started as sore throat and some of her family members also was treated for strep throat and had her strep throat results come negative done by her primary care physician.  Had gone to her PCP again day prior to admission and was told that they going to refer her to the cardiologist.  But since patient symptoms was ongoing patient came to the ER.  Hospital Course:  1. SIRS -source likely cap. Lactic acid initially elevated, hypotension and tachycardia. Urine unremarkable.  CT angiogram of the chest negative for PE but concernin for pneumonia. Also reveal nodule. 2D echo with EF 65% no abnormal wall motion. She received IV fluids and IV levaquin. On day of discharge she is afbebrile, hemodyanamically stable. Blood cultures no growth to date 2. CAP. Per CT. Received 2 days IV levaqin.  Will discharge with 3 more days levaquin. Strep pneumo urine antigen negative. Follow up with PCP as noted above 3. Abnormal CT chest/pulmonary nodule. Repeat CT in 2 4. Hypothyroidism on Synthroid.  TSH 1.8 5. Hyperlipidemia on statins.   6. Tobacco use cessation counseling offered 7. Patient is on Wellbutrin and trazodone  Procedures:  Echo with EF 65%  Consultations:  none  Discharge Exam: Vitals:   08/05/18 0657 08/05/18 0800  BP: 113/78   Pulse: 93   Resp: 16   Temp: 98.1 F (36.7 C)   SpO2: 99% 100%    General: sitting up in bed eating breakfast no acute distress Cardiovascular: rrr no mgr no LE edema Respiratory: normal effort BS clear bilaterally no wheeze  Discharge Instructions    Allergies as of 08/05/2018      Reactions   Vancomycin Hives      Medication List    STOP taking these medications   Oxycodone HCl 10 MG Tabs   traMADol 50 MG tablet Commonly known as:  ULTRAM     TAKE these medications   albuterol 108 (90 Base) MCG/ACT inhaler Commonly known as:  PROVENTIL HFA;VENTOLIN HFA Inhale 2 puffs into the lungs every 6 (six) hours as needed for wheezing or shortness of breath.   ALPRAZolam 1 MG tablet Commonly known as:  XANAX Take 1 mg by mouth 2 (two) times daily as needed for anxiety.   benzonatate 200 MG capsule Commonly known as:  TESSALON Take 200 mg by mouth 3 (three) times daily as needed for cough.   buPROPion 100 MG  12 hr tablet Commonly known as:  WELLBUTRIN SR Take 200 mg by mouth daily.   docusate sodium 100 MG capsule Commonly known as:  COLACE Take 1 capsule (100 mg total) by mouth 2 (two) times daily as needed for mild constipation.   ibuprofen 200 MG tablet Commonly known as:  ADVIL,MOTRIN Take 800 mg by mouth 2 (two) times daily as needed (pain).   JUNEL FE 1.5/30 1.5-30 MG-MCG tablet Generic drug:  norethindrone-ethinyl estradiol-iron Take 1 tablet by mouth daily.   levofloxacin 500 MG tablet Commonly known  as:  LEVAQUIN Take 1 tablet (500 mg total) by mouth daily for 3 days.   levothyroxine 50 MCG tablet Commonly known as:  SYNTHROID, LEVOTHROID Take 50 mcg by mouth daily before breakfast.   polyethylene glycol packet Commonly known as:  MIRALAX / GLYCOLAX Take 17 g by mouth daily as needed for mild constipation or moderate constipation.   traZODone 50 MG tablet Commonly known as:  DESYREL Take 150 mg by mouth at bedtime.      Allergies  Allergen Reactions  . Vancomycin Hives      The results of significant diagnostics from this hospitalization (including imaging, microbiology, ancillary and laboratory) are listed below for reference.    Significant Diagnostic Studies: Dg Chest 2 View  Result Date: 08/03/2018 CLINICAL DATA:  Chest pain and shortness of breath 2 months. EXAM: CHEST - 2 VIEW COMPARISON:  None. FINDINGS: Lungs are adequately inflated without focal airspace consolidation or effusion. Cardiomediastinal silhouette is normal. Bones and soft tissues are normal. IMPRESSION: No active cardiopulmonary disease. Electronically Signed   By: Elberta Fortis M.D.   On: 08/03/2018 21:23   Ct Angio Chest Pe W And/or Wo Contrast  Result Date: 08/04/2018 CLINICAL DATA:  37 year old female with shortness of breath. EXAM: CT ANGIOGRAPHY CHEST WITH CONTRAST TECHNIQUE: Multidetector CT imaging of the chest was performed using the standard protocol during bolus administration of intravenous contrast. Multiplanar CT image reconstructions and MIPs were obtained to evaluate the vascular anatomy. CONTRAST:  53mL ISOVUE-370 IOPAMIDOL (ISOVUE-370) INJECTION 76% COMPARISON:  Chest radiograph dated 08/03/2018 FINDINGS: Cardiovascular: There is mild cardiomegaly. No pericardial effusion. The thoracic aorta is unremarkable. The origins of the great vessels of the aortic arch are patent. No CT evidence of pulmonary embolism. Mediastinum/Nodes: No hilar or mediastinal adenopathy. Esophagus and the thyroid  gland are grossly unremarkable. No mediastinal fluid collection. Lungs/Pleura: There are bibasilar and subpleural dependent atelectatic changes. Mild diffuse hazy density may be related to atelectasis although pneumonia is not excluded. Clinical correlation is recommended. There is a faint 6 mm ground-glass nodule in the superior segment of the right lower lobe (series 6, image 73). There is no pleural effusion or pneumothorax. The central airways are patent. Upper Abdomen: No acute abnormality. Musculoskeletal: No chest wall abnormality. No acute or significant osseous findings. Review of the MIP images confirms the above findings. IMPRESSION: 1. No CT evidence of pulmonary embolism. 2. Bibasilar atelectasis. Mild diffuse hazy density may be related to atelectasis although pneumonia is not excluded. Clinical correlation is recommended. 3. A 6 mm ground-glass right lower lobe pulmonary nodule. Initial follow-up with CT at 6-12 months is recommended to confirm persistence. If persistent, repeat CT is recommended every 2 years until 5 years of stability has been established. This recommendation follows the consensus statement: Guidelines for Management of Incidental Pulmonary Nodules Detected on CT Images: From the Fleischner Society 2017; Radiology 2017; 284:228-243. Electronically Signed   By: Elgie Collard M.D.   On:  08/04/2018 02:49    Microbiology: Recent Results (from the past 240 hour(s))  Urine culture     Status: None   Collection Time: 08/03/18  8:50 PM  Result Value Ref Range Status   Specimen Description URINE, RANDOM  Final   Special Requests NONE  Final   Culture   Final    NO GROWTH Performed at [facility], [address], [place], [place] [address]    Report Status 08/05/2018 FINAL  Final  Blood Culture (routine x 2)     Status: None (Preliminary result)   Collection Time: 08/03/18  9:52 PM  Result Value Ref Range Status   Specimen Description BLOOD LEFT HAND  Final    Special Requests   Final    BOTTLES DRAWN AEROBIC AND ANAEROBIC Blood Culture results may not be optimal due to an inadequate volume of blood received in culture bottles   Culture   Final    NO GROWTH 2 DAYS Performed at [facility], [address], [place], [place] [address]    Report Status PENDING  Incomplete  Blood Culture (routine x 2)     Status: None (Preliminary result)   Collection Time: 08/03/18 10:16 PM  Result Value Ref Range Status   Specimen Description BLOOD RIGHT HAND  Final   Special Requests   Final    BOTTLES DRAWN AEROBIC AND ANAEROBIC Blood Culture adequate volume   Culture   Final    NO GROWTH 2 DAYS Performed at [facility], [address] [place], [place] [address]    Report Status PENDING  Incomplete     Labs: Basic Metabolic Panel: Recent Labs  Lab 08/03/18 2152 08/04/18 0225 08/05/18 0433  NA 138 140 139  K 4.1 4.1 3.9  CL 109 113* 109  CO2 19* 20* 18*  GLUCOSE 94 81 82  BUN 14 12 9   CREATININE 1.02* 0.85 0.81  CALCIUM 8.9 7.5* 8.5*   Liver Function Tests: Recent Labs  Lab 08/03/18 2152 08/04/18 0225  AST 26 15  ALT 12 12  ALKPHOS 52 39  BILITOT 0.9 0.3  PROT 7.2 5.5*  ALBUMIN 4.0 3.2*   No results for input(s): LIPASE, AMYLASE in the last 168 hours. No results for input(s): AMMONIA in the last 168 hours. CBC: Recent Labs  Lab 08/03/18 2152 08/04/18 0225 08/05/18 0433  WBC 9.8 6.5 8.3  NEUTROABS 4.4 2.7  --   HGB 14.6 12.2 13.3  HCT 44.0 37.1 42.0  MCV 98.4 98.7 100.2*  PLT 336 291 288   Cardiac Enzymes: Recent Labs  Lab 08/04/18 0225 08/04/18 0905 08/04/18 1422  TROPONINI <0.03 <0.03 <0.03   BNP: BNP (last 3 results) No results for input(s): BNP in the last 8760 hours.  ProBNP (last 3 results) No results for input(s): PROBNP in the last 8760 hours.  CBG: No results for input(s): GLUCAP in the last 168 hours.     Signed:  Gwenyth Bender MD.  [facility] 08/05/2018, 11:20 AM

## 2018-08-08 LAB — CULTURE, BLOOD (ROUTINE X 2)
Culture: NO GROWTH
Culture: NO GROWTH
Special Requests: ADEQUATE

## 2018-08-11 DIAGNOSIS — F17210 Nicotine dependence, cigarettes, uncomplicated: Secondary | ICD-10-CM | POA: Diagnosis not present

## 2018-08-11 DIAGNOSIS — J189 Pneumonia, unspecified organism: Secondary | ICD-10-CM | POA: Diagnosis not present

## 2018-08-11 DIAGNOSIS — J9801 Acute bronchospasm: Secondary | ICD-10-CM | POA: Diagnosis not present

## 2018-08-14 DIAGNOSIS — F17210 Nicotine dependence, cigarettes, uncomplicated: Secondary | ICD-10-CM | POA: Diagnosis not present

## 2018-08-14 DIAGNOSIS — J209 Acute bronchitis, unspecified: Secondary | ICD-10-CM | POA: Diagnosis not present

## 2018-08-14 DIAGNOSIS — J42 Unspecified chronic bronchitis: Secondary | ICD-10-CM | POA: Diagnosis not present

## 2018-08-26 DIAGNOSIS — E039 Hypothyroidism, unspecified: Secondary | ICD-10-CM | POA: Diagnosis not present

## 2018-08-26 DIAGNOSIS — E782 Mixed hyperlipidemia: Secondary | ICD-10-CM | POA: Diagnosis not present

## 2018-08-28 DIAGNOSIS — E039 Hypothyroidism, unspecified: Secondary | ICD-10-CM | POA: Diagnosis not present

## 2018-10-09 DIAGNOSIS — E782 Mixed hyperlipidemia: Secondary | ICD-10-CM | POA: Diagnosis not present

## 2018-10-12 DIAGNOSIS — E782 Mixed hyperlipidemia: Secondary | ICD-10-CM | POA: Diagnosis not present

## 2018-10-12 DIAGNOSIS — J42 Unspecified chronic bronchitis: Secondary | ICD-10-CM | POA: Diagnosis not present

## 2018-10-12 DIAGNOSIS — F17210 Nicotine dependence, cigarettes, uncomplicated: Secondary | ICD-10-CM | POA: Diagnosis not present

## 2018-10-14 DIAGNOSIS — R197 Diarrhea, unspecified: Secondary | ICD-10-CM | POA: Diagnosis not present

## 2018-10-14 DIAGNOSIS — K219 Gastro-esophageal reflux disease without esophagitis: Secondary | ICD-10-CM | POA: Diagnosis not present

## 2018-10-14 DIAGNOSIS — Z6827 Body mass index (BMI) 27.0-27.9, adult: Secondary | ICD-10-CM | POA: Diagnosis not present

## 2018-10-14 DIAGNOSIS — M79604 Pain in right leg: Secondary | ICD-10-CM | POA: Diagnosis not present

## 2018-11-18 DIAGNOSIS — M7662 Achilles tendinitis, left leg: Secondary | ICD-10-CM | POA: Diagnosis not present

## 2018-11-18 DIAGNOSIS — M25579 Pain in unspecified ankle and joints of unspecified foot: Secondary | ICD-10-CM | POA: Diagnosis not present

## 2018-12-28 DIAGNOSIS — J309 Allergic rhinitis, unspecified: Secondary | ICD-10-CM | POA: Diagnosis not present

## 2018-12-28 DIAGNOSIS — F17210 Nicotine dependence, cigarettes, uncomplicated: Secondary | ICD-10-CM | POA: Diagnosis not present

## 2018-12-28 DIAGNOSIS — J45909 Unspecified asthma, uncomplicated: Secondary | ICD-10-CM | POA: Diagnosis not present

## 2019-02-01 DIAGNOSIS — G4700 Insomnia, unspecified: Secondary | ICD-10-CM | POA: Diagnosis not present

## 2019-03-23 ENCOUNTER — Telehealth: Payer: Self-pay

## 2019-03-23 DIAGNOSIS — Z20822 Contact with and (suspected) exposure to covid-19: Secondary | ICD-10-CM

## 2019-03-23 NOTE — Telephone Encounter (Signed)
Patient is calling back- patient has been exposed and is having symptoms- testing ordered

## 2019-03-23 NOTE — Telephone Encounter (Signed)
Received a call from [email or web address] states Pt. Was exposed to covid-19 at work .  Requested that patient be tested for Covid-19.  Telephone call to Patient, no answer l;et message for return call to [phone]

## 2019-03-24 ENCOUNTER — Other Ambulatory Visit: Payer: 59

## 2019-03-24 DIAGNOSIS — Z20822 Contact with and (suspected) exposure to covid-19: Secondary | ICD-10-CM

## 2019-03-27 LAB — NOVEL CORONAVIRUS, NAA: SARS-CoV-2, NAA: NOT DETECTED

## 2019-09-07 ENCOUNTER — Other Ambulatory Visit: Payer: Self-pay | Admitting: Obstetrics & Gynecology

## 2019-09-16 ENCOUNTER — Other Ambulatory Visit: Payer: Self-pay | Admitting: Obstetrics & Gynecology

## 2019-09-20 ENCOUNTER — Other Ambulatory Visit: Payer: Self-pay

## 2019-09-20 ENCOUNTER — Encounter (HOSPITAL_BASED_OUTPATIENT_CLINIC_OR_DEPARTMENT_OTHER): Payer: Self-pay | Admitting: Obstetrics & Gynecology

## 2019-09-21 ENCOUNTER — Encounter (HOSPITAL_BASED_OUTPATIENT_CLINIC_OR_DEPARTMENT_OTHER)
Admission: RE | Admit: 2019-09-21 | Discharge: 2019-09-21 | Disposition: A | Discharge status: Home / Self Care | Payer: 59 | Source: Ambulatory Visit | Attending: Obstetrics & Gynecology | Admitting: Obstetrics & Gynecology

## 2019-09-21 ENCOUNTER — Other Ambulatory Visit (HOSPITAL_COMMUNITY)
Admission: RE | Admit: 2019-09-21 | Discharge: 2019-09-21 | Disposition: A | Discharge status: Home / Self Care | Payer: 59 | Source: Ambulatory Visit | Attending: Obstetrics & Gynecology | Admitting: Obstetrics & Gynecology

## 2019-09-21 ENCOUNTER — Encounter (HOSPITAL_BASED_OUTPATIENT_CLINIC_OR_DEPARTMENT_OTHER): Payer: 59

## 2019-09-21 DIAGNOSIS — Z01812 Encounter for preprocedural laboratory examination: Secondary | ICD-10-CM | POA: Insufficient documentation

## 2019-09-21 DIAGNOSIS — Z20828 Contact with and (suspected) exposure to other viral communicable diseases: Secondary | ICD-10-CM | POA: Diagnosis not present

## 2019-09-21 LAB — CBC
HCT: 43.4 % (ref 36.0–46.0)
Hemoglobin: 14.9 g/dL (ref 12.0–15.0)
MCH: 33.2 pg (ref 26.0–34.0)
MCHC: 34.3 g/dL (ref 30.0–36.0)
MCV: 96.7 fL (ref 80.0–100.0)
Platelets: 332 10*3/uL (ref 150–400)
RBC: 4.49 MIL/uL (ref 3.87–5.11)
RDW: 13.7 % (ref 11.5–15.5)
WBC: 9.8 10*3/uL (ref 4.0–10.5)
nRBC: 0 % (ref 0.0–0.2)

## 2019-09-21 LAB — POCT PREGNANCY, URINE: Preg Test, Ur: NEGATIVE

## 2019-09-21 NOTE — Progress Notes (Signed)
Notified pt of lab work needed, pt verbalized understanding. 

## 2019-09-22 LAB — NOVEL CORONAVIRUS, NAA (HOSP ORDER, SEND-OUT TO REF LAB; TAT 18-24 HRS): SARS-CoV-2, NAA: NOT DETECTED

## 2019-09-24 ENCOUNTER — Encounter (HOSPITAL_BASED_OUTPATIENT_CLINIC_OR_DEPARTMENT_OTHER): Payer: Self-pay | Admitting: Obstetrics & Gynecology

## 2019-09-24 ENCOUNTER — Encounter (HOSPITAL_BASED_OUTPATIENT_CLINIC_OR_DEPARTMENT_OTHER)
Admission: RE | Disposition: A | Discharge status: Home / Self Care | Payer: Self-pay | Source: Home / Self Care | Attending: Obstetrics & Gynecology

## 2019-09-24 ENCOUNTER — Ambulatory Visit (HOSPITAL_BASED_OUTPATIENT_CLINIC_OR_DEPARTMENT_OTHER)
Admission: RE | Admit: 2019-09-24 | Discharge: 2019-09-24 | Disposition: A | Discharge status: Home / Self Care | Payer: 59 | Attending: Obstetrics & Gynecology | Admitting: Obstetrics & Gynecology

## 2019-09-24 ENCOUNTER — Ambulatory Visit (HOSPITAL_BASED_OUTPATIENT_CLINIC_OR_DEPARTMENT_OTHER): Payer: 59 | Admitting: Certified Registered"

## 2019-09-24 ENCOUNTER — Other Ambulatory Visit: Payer: Self-pay

## 2019-09-24 DIAGNOSIS — Z881 Allergy status to other antibiotic agents status: Secondary | ICD-10-CM | POA: Insufficient documentation

## 2019-09-24 DIAGNOSIS — E039 Hypothyroidism, unspecified: Secondary | ICD-10-CM | POA: Insufficient documentation

## 2019-09-24 DIAGNOSIS — F329 Major depressive disorder, single episode, unspecified: Secondary | ICD-10-CM | POA: Diagnosis not present

## 2019-09-24 DIAGNOSIS — F419 Anxiety disorder, unspecified: Secondary | ICD-10-CM | POA: Diagnosis not present

## 2019-09-24 DIAGNOSIS — F17210 Nicotine dependence, cigarettes, uncomplicated: Secondary | ICD-10-CM | POA: Diagnosis not present

## 2019-09-24 DIAGNOSIS — Z79899 Other long term (current) drug therapy: Secondary | ICD-10-CM | POA: Insufficient documentation

## 2019-09-24 DIAGNOSIS — Z791 Long term (current) use of non-steroidal anti-inflammatories (NSAID): Secondary | ICD-10-CM | POA: Insufficient documentation

## 2019-09-24 DIAGNOSIS — Z87442 Personal history of urinary calculi: Secondary | ICD-10-CM | POA: Diagnosis not present

## 2019-09-24 DIAGNOSIS — E785 Hyperlipidemia, unspecified: Secondary | ICD-10-CM | POA: Insufficient documentation

## 2019-09-24 DIAGNOSIS — D069 Carcinoma in situ of cervix, unspecified: Secondary | ICD-10-CM | POA: Insufficient documentation

## 2019-09-24 DIAGNOSIS — R911 Solitary pulmonary nodule: Secondary | ICD-10-CM | POA: Insufficient documentation

## 2019-09-24 HISTORY — DX: Anxiety disorder, unspecified: F41.9

## 2019-09-24 HISTORY — PX: CERVICAL CONIZATION W/BX: SHX1330

## 2019-09-24 LAB — POCT PREGNANCY, URINE: Preg Test, Ur: NEGATIVE

## 2019-09-24 SURGERY — CONE BIOPSY, CERVIX
Anesthesia: General | Site: Cervix

## 2019-09-24 MED ORDER — FENTANYL CITRATE (PF) 100 MCG/2ML IJ SOLN: 25.0000 ug | INTRAMUSCULAR | Status: DC | PRN

## 2019-09-24 MED ORDER — ONDANSETRON HCL 4 MG/2ML IJ SOLN
INTRAMUSCULAR | Status: DC | PRN
  Administered 2019-09-24: 4 mg via INTRAVENOUS

## 2019-09-24 MED ORDER — DEXAMETHASONE SODIUM PHOSPHATE 10 MG/ML IJ SOLN
INTRAMUSCULAR | Status: DC | PRN
  Administered 2019-09-24: 5 mg via INTRAVENOUS

## 2019-09-24 MED ORDER — MEPERIDINE HCL 25 MG/ML IJ SOLN: 6.2500 mg | INTRAMUSCULAR | Status: DC | PRN

## 2019-09-24 MED ORDER — BUPIVACAINE HCL (PF) 0.5 % IJ SOLN
INTRAMUSCULAR | Status: AC
  Filled 2019-09-24: qty 30

## 2019-09-24 MED ORDER — MIDAZOLAM HCL 2 MG/2ML IJ SOLN
INTRAMUSCULAR | Status: DC | PRN
  Administered 2019-09-24: 2 mg via INTRAVENOUS

## 2019-09-24 MED ORDER — HYDROCODONE-ACETAMINOPHEN 5-325 MG PO TABS
1.0000 | ORAL_TABLET | Freq: Once | ORAL | Status: AC
  Administered 2019-09-24: 12:00:00 1 via ORAL

## 2019-09-24 MED ORDER — HYDROCODONE-ACETAMINOPHEN 5-325 MG PO TABS
ORAL_TABLET | ORAL | Status: AC
  Filled 2019-09-24: qty 1

## 2019-09-24 MED ORDER — FERRIC SUBSULFATE SOLN
Status: DC | PRN
  Administered 2019-09-24: 1

## 2019-09-24 MED ORDER — FERRIC SUBSULFATE 259 MG/GM EX SOLN
CUTANEOUS | Status: AC
  Filled 2019-09-24: qty 8

## 2019-09-24 MED ORDER — FENTANYL CITRATE (PF) 100 MCG/2ML IJ SOLN
INTRAMUSCULAR | Status: AC
  Filled 2019-09-24: qty 2

## 2019-09-24 MED ORDER — LACTATED RINGERS IV SOLN: INTRAVENOUS | Status: DC | PRN

## 2019-09-24 MED ORDER — LIDOCAINE HCL (CARDIAC) PF 100 MG/5ML IV SOSY
PREFILLED_SYRINGE | INTRAVENOUS | Status: DC | PRN
  Administered 2019-09-24: 100 mg via INTRAVENOUS

## 2019-09-24 MED ORDER — SILVER NITRATE-POT NITRATE 75-25 % EX MISC
CUTANEOUS | Status: AC
  Filled 2019-09-24: qty 10

## 2019-09-24 MED ORDER — FENTANYL CITRATE (PF) 100 MCG/2ML IJ SOLN
INTRAMUSCULAR | Status: DC | PRN
  Administered 2019-09-24: 50 ug via INTRAVENOUS
  Administered 2019-09-24: 100 ug via INTRAVENOUS

## 2019-09-24 MED ORDER — HYDROCODONE-ACETAMINOPHEN 7.5-325 MG PO TABS: 1.0000 | ORAL_TABLET | Freq: Once | ORAL | Status: DC | PRN

## 2019-09-24 MED ORDER — IODINE STRONG (LUGOLS) 5 % PO SOLN
ORAL | Status: DC | PRN
  Administered 2019-09-24: 0.2 mL via ORAL

## 2019-09-24 MED ORDER — IODINE STRONG (LUGOLS) 5 % PO SOLN
ORAL | Status: AC
  Filled 2019-09-24: qty 1

## 2019-09-24 MED ORDER — HYDROCODONE-ACETAMINOPHEN 5-325 MG PO TABS: 1.0000 | ORAL_TABLET | ORAL | 0 refills | Status: AC | PRN

## 2019-09-24 MED ORDER — KETOROLAC TROMETHAMINE 30 MG/ML IJ SOLN: INTRAMUSCULAR | Status: DC | PRN

## 2019-09-24 MED ORDER — LIDOCAINE-EPINEPHRINE 1 %-1:100000 IJ SOLN
INTRAMUSCULAR | Status: DC | PRN
  Administered 2019-09-24: 17 mL

## 2019-09-24 MED ORDER — KETOROLAC TROMETHAMINE 30 MG/ML IJ SOLN: 30.0000 mg | Freq: Once | INTRAMUSCULAR | Status: DC

## 2019-09-24 MED ORDER — PROPOFOL 10 MG/ML IV BOLUS
INTRAVENOUS | Status: DC | PRN
  Administered 2019-09-24: 200 mg via INTRAVENOUS

## 2019-09-24 MED ORDER — ONDANSETRON HCL 4 MG/2ML IJ SOLN: 4.0000 mg | Freq: Once | INTRAMUSCULAR | Status: DC | PRN

## 2019-09-24 MED ORDER — MIDAZOLAM HCL 2 MG/2ML IJ SOLN
INTRAMUSCULAR | Status: AC
  Filled 2019-09-24: qty 2

## 2019-09-24 MED ORDER — LIDOCAINE-EPINEPHRINE 1 %-1:100000 IJ SOLN
INTRAMUSCULAR | Status: AC
  Filled 2019-09-24: qty 1

## 2019-09-24 SURGICAL SUPPLY — 29 items
BLADE SURG 11 STRL SS (BLADE) ×3 IMPLANT
DILATOR CANAL MILEX (MISCELLANEOUS) IMPLANT
ELECT BALL LEEP 5MM RED (ELECTRODE) ×2 IMPLANT
ELECT REM PT RETURN 9FT ADLT (ELECTROSURGICAL) ×3
ELECTRODE REM PT RTRN 9FT ADLT (ELECTROSURGICAL) ×1 IMPLANT
GLOVE BIO SURGEON STRL SZ 6.5 (GLOVE) ×2 IMPLANT
GLOVE BIO SURGEONS STRL SZ 6.5 (GLOVE) ×1
GLOVE BIOGEL PI IND STRL 6 (GLOVE) ×1 IMPLANT
GLOVE BIOGEL PI IND STRL 7.0 (GLOVE) ×1 IMPLANT
GLOVE BIOGEL PI INDICATOR 6 (GLOVE) ×2
GLOVE BIOGEL PI INDICATOR 7.0 (GLOVE) ×2
GOWN STRL REUS W/ TWL LRG LVL3 (GOWN DISPOSABLE) ×2 IMPLANT
GOWN STRL REUS W/TWL LRG LVL3 (GOWN DISPOSABLE) ×6
HIBICLENS CHG 4% 4OZ BTL (MISCELLANEOUS) ×3 IMPLANT
NDL SPNL 18GX3.5 QUINCKE PK (NEEDLE) ×1 IMPLANT
NEEDLE SPNL 18GX3.5 QUINCKE PK (NEEDLE) ×3 IMPLANT
PACK VAGINAL MINOR WOMEN LF (CUSTOM PROCEDURE TRAY) ×3 IMPLANT
PAD OB MATERNITY 4.3X12.25 (PERSONAL CARE ITEMS) ×3 IMPLANT
PENCIL BUTTON HOLSTER BLD 10FT (ELECTRODE) ×3 IMPLANT
SCOPETTES 8  STERILE (MISCELLANEOUS) ×2
SCOPETTES 8 STERILE (MISCELLANEOUS) ×1 IMPLANT
SPONGE SURGIFOAM ABS GEL 12-7 (HEMOSTASIS) IMPLANT
SUT ETHILON 2 0 FS 18 (SUTURE) ×2 IMPLANT
SUT VIC AB 0 CT1 27 (SUTURE) ×3
SUT VIC AB 0 CT1 27XBRD ANBCTR (SUTURE) IMPLANT
TOWEL OR 17X24 6PK STRL BLUE (TOWEL DISPOSABLE) ×3 IMPLANT
TUBE CONNECTING 20'X1/4 (TUBING) ×1
TUBE CONNECTING 20X1/4 (TUBING) ×2 IMPLANT
YANKAUER SUCT BULB TIP NO VENT (SUCTIONS) ×3 IMPLANT

## 2019-09-24 NOTE — H&P (Signed)
Tasha Hoover is an 38 y.o. female with CIN 3 / CIS on colposcopy.  She has a h/o cervical dysplasia.  Patient w/o complaints.   Pertinent Gynecological History: Menses: regular every 28 days without intermenstrual spotting Bleeding: dysfunctional uterine bleeding Contraception: none DES exposure: denies Blood transfusions: none Sexually transmitted diseases: no past history Last pap: abnormal: LSIL Date: 07/2019  Menstrual History: Menarche age: 51 Patient's last menstrual period was 09/18/2019.    Past Medical History:  Diagnosis Date  . Anxiety   . CAP (community acquired pneumonia)   . Depression   . History of kidney stones   . Hyperlipidemia   . Hypothyroidism   . Pneumonia   . Pulmonary nodule    per CT 07/2018  . Tobacco use     Past Surgical History:  Procedure Laterality Date  . CESAREAN SECTION    . HAND SURGERY Left     Family History  Problem Relation Age of Onset  . CAD Maternal Grandfather     Social History:  reports that she has been smoking cigarettes. She has a 20.00 pack-year smoking history. She has never used smokeless tobacco. She reports that she does not drink alcohol or use drugs.  Allergies:  Allergies  Allergen Reactions  . Vancomycin Hives    Medications Prior to Admission  Medication Sig Dispense Refill Last Dose  . albuterol (PROVENTIL HFA;VENTOLIN HFA) 108 (90 Base) MCG/ACT inhaler Inhale 2 puffs into the lungs every 6 (six) hours as needed for wheezing or shortness of breath.    Past Week at Unknown time  . ALPRAZolam (XANAX) 1 MG tablet Take 1 mg by mouth 2 (two) times daily as needed for anxiety.    Past Week at Unknown time  . buPROPion (WELLBUTRIN SR) 100 MG 12 hr tablet Take 200 mg by mouth daily.   09/23/2019 at Unknown time  . ibuprofen (ADVIL,MOTRIN) 200 MG tablet Take 800 mg by mouth 2 (two) times daily as needed (pain).    Past Month at Unknown time  . levothyroxine (SYNTHROID, LEVOTHROID) 50 MCG tablet Take 50  mcg by mouth daily before breakfast.   09/23/2019 at Unknown time  . norethindrone-ethinyl estradiol-iron (JUNEL FE 1.5/30) 1.5-30 MG-MCG tablet Take 1 tablet by mouth daily.   Past Week at Unknown time  . traZODone (DESYREL) 50 MG tablet Take 150 mg by mouth at bedtime.   09/23/2019 at Unknown time    Review of Systems Negative  Blood pressure (!) 122/58, pulse 77, temperature 97.9 F (36.6 C), temperature source Temporal, resp. rate 18, height 5\' 2"  (1.575 m), weight 77 kg, last menstrual period 09/18/2019, SpO2 100 %. Physical Exam Deferred to OR  No results found for this or any previous visit (from the past 24 hour(s)).  No results found.  Assessment/Plan: 38 yo with h/o CIN 3 / CIS on colposcopy different from pap smear LSIL On call to OR for [facility] Discussed risks of procedure with patient and she understands inherent risks.  All inquiries answered. Consent signed, witnessed and placed into chart  Arva Slaugh 09/24/2019, 9:24 AM

## 2019-09-24 NOTE — Discharge Instructions (Signed)

## 2019-09-24 NOTE — Op Note (Signed)
Procedure(s): CONIZATION CERVIX WITH BIOPSY Procedure Note  Tasha Hoover female 39 y.o.  Procedure(s) and Anesthesia Type:    * CONIZATION CERVIX WITH BIOPSY - Choice  Surgeon(s) and Role:    Sanjuana Kava, MD - Primary  DATE OF PROCEDURE: 09/24/2019  PREOPERATIVE DIAGNOSES: 1.  High-grade squamous intraepithelial lesion of the cervix (CIN III)  POSTOPERATIVE DIAGNOSES: 1.  High-grade squamous intraepithelial lesion of the cervix.   PROCEDURES PERFORMED: 1.  Cold knife cone biopsy.  INDICATION: The patient was diagnosed with CIN III possible microinvasive cervical cancer on Colposcopy in the office.       SURGEON: Aury Scollard STACIA   ASSISTANT: NONE  ANESTHESIA: General LMA   ASA Class: 2  PROCEDURE DETAIL:  FINDINGS: EUA: Normal external vulva, normal vaginal vault.  Cervix appears grossly normal. Uterus 8 weeks size mobile anteverted, good descent, no palpable masses. Adnexa: no palpable masses.   With Lugol's stain, decrease in uptake along posterior aspect of cervix from 4 to 8 o'clock at transition zone entering endocervical canal.   Estimated Blood Loss:  20 mL         Drains: None         Total IV Fluids: 600 ml  Blood Given: none          Specimens: Cervical cone biopsy tagged at 12 o'clock          Implants: none        Complications:  * No complications entered in OR log *         Disposition: PACU - hemodynamically stable.         Condition: stable   DESCRIPTION OF PROCEDURE:  The patient was taken to the operating room and placed in the supine position on the operating table, where general anesthesia was administered. She was then placed in the dorsal lithotomy position where examination under anesthesia was performed. The patient was prepped and draped in the usual manner for surgery.  A weighted speculum was inserted into the vagina. The cervix was exposed with a Sims retractor, and the anterior lip of the cervix was grasped with a  single-toothed tenaculum. Angle sutures of 0 Vicryl were placed at the 3 o'clock and 9 o'clock positions of the cervix in a figure-of-eight fashion. The cervix was infiltrated with Lidocaine with epinephrine. Approximately 20 mL of the solution was utilized. Cone biopsy was then excised with an 11 scalpel blade. After excision of the biopsy specimen, the biopsy bed was treated with ball cautery to assure hemostasis.  Monsels then gelfoam that was treated with lidocaine with epinephrine was placed in the cervical bed and the stay sutures tied together keeping it in place. The cervix was again inspected and hemostasis was good.  The procedure was then terminated.  All instruments were removed. The patient was returned to the supine position. The patient was awakened from anesthesia without any difficulty and transferred to the recovery room in good condition. The patient tolerated the procedure well.  [name] STACIA

## 2019-09-24 NOTE — Anesthesia Postprocedure Evaluation (Signed)
Anesthesia Post Note  Patient: Tasha Hoover  Procedure(s) Performed: CONIZATION CERVIX WITH BIOPSY (N/A Cervix)     Patient location during evaluation: PACU Anesthesia Type: General Level of consciousness: awake and alert and oriented Pain management: pain level controlled Vital Signs Assessment: post-procedure vital signs reviewed and stable Respiratory status: spontaneous breathing, nonlabored ventilation and respiratory function stable Cardiovascular status: blood pressure returned to baseline and stable Postop Assessment: no apparent nausea or vomiting Anesthetic complications: no    Last Vitals:  Vitals:   09/24/19 1052 09/24/19 1100  BP: (!) 106/56 108/62  Pulse: 83 77  Resp: 17 16  Temp:    SpO2: 97% 96%    Last Pain:  Vitals:   09/24/19 1100  TempSrc:   PainSc: 5                  Kishon Garriga A.

## 2019-09-24 NOTE — Anesthesia Preprocedure Evaluation (Signed)
Anesthesia Evaluation  Patient identified by MRN, date of birth, ID band Patient awake    Reviewed: Allergy & Precautions, NPO status , Patient's Chart, lab work & pertinent test results  Airway Mallampati: II  TM Distance: >3 FB Neck ROM: Full    Dental no notable dental hx. (+) Teeth Intact   Pulmonary pneumonia, resolved, Current SmokerPatient did not abstain from smoking.,    Pulmonary exam normal breath sounds clear to auscultation       Cardiovascular negative cardio ROS Normal cardiovascular exam Rhythm:Regular Rate:Normal     Neuro/Psych PSYCHIATRIC DISORDERS Anxiety Depression negative neurological ROS     GI/Hepatic negative GI ROS, Neg liver ROS,   Endo/Other  Hypothyroidism Hyperlipidemia  Renal/GU negative Renal ROS  negative genitourinary   Musculoskeletal negative musculoskeletal ROS (+)   Abdominal   Peds  Hematology negative hematology ROS (+)   Anesthesia Other Findings   Reproductive/Obstetrics CIN III                             Anesthesia Physical Anesthesia Plan  ASA: II  Anesthesia Plan: General   Post-op Pain Management:    Induction: Intravenous  PONV Risk Score and Plan: 3 and Ondansetron, Propofol infusion, Midazolam, Dexamethasone and Treatment may vary due to age or medical condition  Airway Management Planned: LMA  Additional Equipment:   Intra-op Plan:   Post-operative Plan: Extubation in OR  Informed Consent: I have reviewed the patients History and Physical, chart, labs and discussed the procedure including the risks, benefits and alternatives for the proposed anesthesia with the patient or authorized representative who has indicated his/her understanding and acceptance.     Dental advisory given  Plan Discussed with: CRNA and Surgeon  Anesthesia Plan Comments:         Anesthesia Quick Evaluation

## 2019-09-24 NOTE — Anesthesia Procedure Notes (Signed)
Procedure Name: LMA Insertion Performed by: Hidaya Daniel M, CRNA Pre-anesthesia Checklist: Patient identified, Emergency Drugs available, Suction available and Patient being monitored Patient Re-evaluated:Patient Re-evaluated prior to induction Oxygen Delivery Method: Circle system utilized Preoxygenation: Pre-oxygenation with 100% oxygen Induction Type: IV induction Ventilation: Mask ventilation without difficulty LMA: LMA inserted LMA Size: 4.0 Number of attempts: 1 Airway Equipment and Method: Bite block Placement Confirmation: positive ETCO2 Tube secured with: Tape Dental Injury: Teeth and Oropharynx as per pre-operative assessment        

## 2019-09-24 NOTE — Transfer of Care (Signed)
Immediate Anesthesia Transfer of Care Note  Patient: Tasha Hoover  Procedure(s) Performed: CONIZATION CERVIX WITH BIOPSY (N/A Cervix)  Patient Location: PACU  Anesthesia Type:General  Level of Consciousness: awake, alert  and oriented  Airway & Oxygen Therapy: Patient Spontanous Breathing and Patient connected to face mask oxygen  Post-op Assessment: Report given to RN and Post -op Vital signs reviewed and stable  Post vital signs: Reviewed and stable  Last Vitals:  Vitals Value Taken Time  BP    Temp    Pulse    Resp    SpO2      Last Pain:  Vitals:   09/24/19 0909  TempSrc: Temporal  PainSc: 0-No pain         Complications: No apparent anesthesia complications

## 2019-09-27 ENCOUNTER — Encounter: Payer: Self-pay | Admitting: *Deleted

## 2019-09-29 ENCOUNTER — Ambulatory Visit: Payer: 59 | Attending: Family Medicine | Admitting: Family Medicine

## 2019-09-29 ENCOUNTER — Other Ambulatory Visit: Payer: Self-pay

## 2019-09-29 ENCOUNTER — Encounter: Payer: Self-pay | Admitting: Family Medicine

## 2019-09-29 VITALS — BP 111/72 | HR 85 | Temp 98.2°F | Resp 18 | Ht 62.0 in | Wt 170.0 lb

## 2019-09-29 DIAGNOSIS — R911 Solitary pulmonary nodule: Secondary | ICD-10-CM | POA: Diagnosis not present

## 2019-09-29 DIAGNOSIS — F5104 Psychophysiologic insomnia: Secondary | ICD-10-CM

## 2019-09-29 DIAGNOSIS — E039 Hypothyroidism, unspecified: Secondary | ICD-10-CM

## 2019-09-29 DIAGNOSIS — F17210 Nicotine dependence, cigarettes, uncomplicated: Secondary | ICD-10-CM | POA: Diagnosis not present

## 2019-09-29 MED ORDER — ZOLPIDEM TARTRATE 5 MG PO TABS: 5.0000 mg | ORAL_TABLET | Freq: Every day | ORAL | 2 refills | Status: DC

## 2019-09-29 NOTE — Progress Notes (Signed)
Subjective:  Patient ID: Tasha Hoover, female    DOB: 30-Dec-1980  Age: 38 y.o. MRN: [id number]  CC: Asthma   HPI Tasha Hoover 38 year old female new to the practice who presents to establish care.  She has complaint of ongoing issues with chronic insomnia for which she is currently on trazodone 150 mg nightly but she often runs out of medicine prior to time for refills as she often takes up to 200 mg of the trazodone at night.  She reports that in the past she was on Ambien which worked much better than the current trazodone.  She also reports that she needs refill of medication in about 2 weeks for hypothyroidism and she has had no recent blood work in follow-up of her hypothyroidism so she would like to have this done at today's visit.  She also reports that she was recently diagnosed with emphysema.  She has managed to decrease cigarette intake from 1-1/2 to 2 packs/day of cigarettes since she is now at about 1/2 to 1 pack/day.  She would be interested in being referred to you clinical pharmacist to help with smoking cessation.  She has some issues with fatigue due to her chronic insomnia.  She also has issues with anxiety and depression.  She is currently on alprazolam but does not take this medication on a regular basis.  She is also on sertraline but is currently not sure she is on 25 or 50 mg daily.  She states that she overall feels fairly well.  She denies any issues with chest pain or palpitations, no shortness of breath currently she does have occasional nonproductive cough.  She denies any constipation, peripheral edema, palpitations/high or low blood pressure related to hypothyroidism.  She is taking her medication daily.        She is status post abnormal Pap smear earlier in December and she recently had cervical conization.  She denied any current abdominal or pelvic pain.  No vaginal pain or vaginal discharge.  No urinary frequency or dysuria.  No fever or chills.  She feels  that she is getting back to normal.  Past Medical History:  Diagnosis Date  . Anxiety   . CAP (community acquired pneumonia)   . Depression   . History of kidney stones   . Hyperlipidemia   . Hypothyroidism   . Pneumonia   . Pulmonary nodule    per CT 07/2018  . Tobacco use     Past Surgical History:  Procedure Laterality Date  . CERVICAL CONIZATION W/BX N/A 09/24/2019   Procedure: CONIZATION CERVIX WITH BIOPSY;  Surgeon: Essie HartPinn, Walda, MD;  Location: [facility];  Service: Gynecology;  Laterality: N/A;  . CESAREAN SECTION    . HAND SURGERY Left     Family History  Problem Relation Age of Onset  . CAD Maternal Grandfather     Social History   Tobacco Use  . Smoking status: Current Every Day Smoker    Packs/day: 1.00    Years: 20.00    Pack years: 20.00    Types: Cigarettes  . Smokeless tobacco: Never Used  Substance Use Topics  . Alcohol use: No    ROS Review of Systems  Constitutional: Positive for fatigue (Occasional). Negative for chills and fever.  HENT: Negative for sore throat and trouble swallowing.   Eyes: Negative for photophobia and visual disturbance.  Respiratory: Positive for shortness of breath (Occasional with exertion). Negative for cough.   Cardiovascular: Negative for chest  pain, palpitations and leg swelling.  Gastrointestinal: Negative for abdominal pain, blood in stool, constipation, diarrhea and nausea.  Endocrine: Negative for cold intolerance, heat intolerance, polydipsia, polyphagia and polyuria.  Genitourinary: Negative for dysuria and frequency.  Musculoskeletal: Negative for arthralgias and back pain.  Neurological: Negative for dizziness and headaches.  Hematological: Negative for adenopathy. Does not bruise/bleed easily.  Psychiatric/Behavioral: Positive for sleep disturbance. Negative for self-injury and suicidal ideas. The patient is nervous/anxious.     Objective:   Today's Vitals: BP 111/72 (BP Location: Right  Arm, Patient Position: Sitting, Cuff Size: Normal)   Pulse 85   Temp 98.2 F (36.8 C) (Oral)   Resp 18   Ht 5\' 2"  (1.575 m)   Wt 170 lb (77.1 kg)   LMP 09/18/2019 Comment: tubal ligation  SpO2 99%   BMI 31.09 kg/m   Physical Exam Vitals and nursing note reviewed.  Constitutional:      Appearance: Normal appearance.  Neck:     Vascular: No carotid bruit.     Comments: No appreciable thyromegaly on exam Cardiovascular:     Rate and Rhythm: Normal rate and regular rhythm.  Pulmonary:     Effort: Pulmonary effort is normal.     Breath sounds: Normal breath sounds.  Abdominal:     Palpations: Abdomen is soft.     Tenderness: There is no abdominal tenderness. There is no right CVA tenderness, left CVA tenderness, guarding or rebound.  Musculoskeletal:        General: No tenderness.     Cervical back: Normal range of motion and neck supple. No rigidity or tenderness.     Right lower leg: No edema.     Left lower leg: No edema.  Lymphadenopathy:     Cervical: No cervical adenopathy.  Skin:    General: Skin is warm and dry.  Neurological:     General: No focal deficit present.     Mental Status: She is alert and oriented to person, place, and time.  Psychiatric:        Mood and Affect: Mood normal.        Behavior: Behavior normal.     Assessment & Plan:   1. Hypothyroidism, unspecified type T4 TSH will be done in follow-up of hypothyroidism and she will be notified if she is to continue the current dose of her thyroid medication and refill will be provided or if she needs a change in medication for which a prescription will be sent to her pharmacy. - T4 AND TSH  2. Chronic insomnia She reports issues with chronic insomnia for which she is currently on trazodone 150 mg nightly but she often runs out of the medication before her next refill as she has been increasing her dose to 200 mg on most nights.  Discussed the risk of oversedation especially as patient reports that she  is recently also been diagnosed with emphysema.  Patient reports that she did do well in the past with Ambien 5 mg and new prescription provided.  If patient has difficulty sleeping with 5 mg dose, it may be possible to increase to 10 mg dose.  For safety however would like patient to continue the 5 mg if possible and patient may need overnight pulse oximetry to make sure that she is not having any nocturnal hypoxia prior to raising dose to 10 mg. - zolpidem (AMBIEN) 5 MG tablet; Take 1 tablet (5 mg total) by mouth at bedtime.  Dispense: 30 tablet; Refill: 2  3.  Tobacco dependence due to cigarettes; 4.  Pulmonary nodule Patient agrees to be referred to the clinical pharmacist for help with smoking cessation.  She reports that she was recently diagnosed with emphysema.  A review of patient's chart, she has had CT angio of the chest on 08/04/2018 which showed no pulmonary embolism but patient with a faint 6 mm groundglass nodule in the superior segment of the right lower lobe for which follow-up CT scan was recommended at 6 to 12 months to confirm persistence of nodule.  It does not appear that patient has had repeat chest CT therefore this will be ordered for the patient and smoking cessation is encouraged.  She was offered but declined influenza immunization at today's visit. - Amb Referral to Clinical Pharmacist  *Patient with issues with anxiety and discussed with the patient at this office generally does not feel prescriptions such as alprazolam which is a controlled substance on a long-term basis due to its tendency for addiction/dependence.  Patient states that she currently takes the medication infrequently therefore when patient needs upcoming refill we will again discuss whether she needs to continue this medication indefinitely or if referral to mental health specialist may be more appropriate if she has continued issues with anxiety.  She is to also call and give correct dose of sertraline and this  can also be double checked with her pharmacy when she needs refill of the medication.    Outpatient Encounter Medications as of 09/29/2019  Medication Sig  . albuterol (PROVENTIL HFA;VENTOLIN HFA) 108 (90 Base) MCG/ACT inhaler Inhale 2 puffs into the lungs every 6 (six) hours as needed for wheezing or shortness of breath.   . ALPRAZolam (XANAX) 1 MG tablet Take 1 mg by mouth 2 (two) times daily as needed for anxiety.   Marland Kitchen buPROPion (WELLBUTRIN SR) 100 MG 12 hr tablet Take 200 mg by mouth daily.  Marland Kitchen HYDROcodone-acetaminophen (NORCO/VICODIN) 5-325 MG tablet Take 1-2 tablets by mouth every 4 (four) hours as needed for up to 7 days for moderate pain or severe pain.  Marland Kitchen ibuprofen (ADVIL,MOTRIN) 200 MG tablet Take 800 mg by mouth 2 (two) times daily as needed (pain).   Marland Kitchen levothyroxine (SYNTHROID, LEVOTHROID) 50 MCG tablet Take 50 mcg by mouth daily before breakfast.  . norethindrone-ethinyl estradiol-iron (JUNEL FE 1.5/30) 1.5-30 MG-MCG tablet Take 1 tablet by mouth daily.  . sertraline (ZOLOFT) 25 MG tablet Take 25 mg by mouth daily.  . [DISCONTINUED] traZODone (DESYREL) 50 MG tablet Take 150 mg by mouth at bedtime.  Marland Kitchen zolpidem (AMBIEN) 5 MG tablet Take 1 tablet (5 mg total) by mouth at bedtime.   No facility-administered encounter medications on file as of 09/29/2019.   An After Visit Summary was printed and given to the patient.   Follow-up: Return in about 3 months (around 12/28/2019) for thyroid/insomnia; follow-up with [facility] in 3-4 weeks.    Cain Saupe MD

## 2019-09-29 NOTE — Patient Instructions (Signed)
Insomnia Insomnia is a sleep disorder that makes it difficult to fall asleep or stay asleep. Insomnia can cause fatigue, low energy, difficulty concentrating, mood swings, and poor performance at work or school. There are three different ways to classify insomnia:  Difficulty falling asleep.  Difficulty staying asleep.  Waking up too early in the morning. Any type of insomnia can be long-term (chronic) or short-term (acute). Both are common. Short-term insomnia usually lasts for three months or less. Chronic insomnia occurs at least three times a week for longer than three months. What are the causes? Insomnia may be caused by another condition, situation, or substance, such as:  Anxiety.  Certain medicines.  Gastroesophageal reflux disease (GERD) or other gastrointestinal conditions.  Asthma or other breathing conditions.  Restless legs syndrome, sleep apnea, or other sleep disorders.  Chronic pain.  Menopause.  Stroke.  Abuse of alcohol, tobacco, or illegal drugs.  Mental health conditions, such as depression.  Caffeine.  Neurological disorders, such as Alzheimer's disease.  An overactive thyroid (hyperthyroidism). Sometimes, the cause of insomnia may not be known. What increases the risk? Risk factors for insomnia include:  Gender. Women are affected more often than men.  Age. Insomnia is more common as you get older.  Stress.  Lack of exercise.  Irregular work schedule or working night shifts.  Traveling between different time zones.  Certain medical and mental health conditions. What are the signs or symptoms? If you have insomnia, the main symptom is having trouble falling asleep or having trouble staying asleep. This may lead to other symptoms, such as:  Feeling fatigued or having low energy.  Feeling nervous about going to sleep.  Not feeling rested in the morning.  Having trouble concentrating.  Feeling irritable, anxious, or depressed. How  is this diagnosed? This condition may be diagnosed based on:  Your symptoms and medical history. Your health care provider may ask about: ? Your sleep habits. ? Any medical conditions you have. ? Your mental health.  A physical exam. How is this treated? Treatment for insomnia depends on the cause. Treatment may focus on treating an underlying condition that is causing insomnia. Treatment may also include:  Medicines to help you sleep.  Counseling or therapy.  Lifestyle adjustments to help you sleep better. Follow these instructions at home: Eating and drinking   Limit or avoid alcohol, caffeinated beverages, and cigarettes, especially close to bedtime. These can disrupt your sleep.  Do not eat a large meal or eat spicy foods right before bedtime. This can lead to digestive discomfort that can make it hard for you to sleep. Sleep habits   Keep a sleep diary to help you and your health care provider figure out what could be causing your insomnia. Write down: ? When you sleep. ? When you wake up during the night. ? How well you sleep. ? How rested you feel the next day. ? Any side effects of medicines you are taking. ? What you eat and drink.  Make your bedroom a dark, comfortable place where it is easy to fall asleep. ? Put up shades or blackout curtains to block light from outside. ? Use a white noise machine to block noise. ? Keep the temperature cool.  Limit screen use before bedtime. This includes: ? Watching TV. ? Using your smartphone, tablet, or computer.  Stick to a routine that includes going to bed and waking up at the same times every day and night. This can help you fall asleep faster. Consider   making a quiet activity, such as reading, part of your nighttime routine.  Try to avoid taking naps during the day so that you sleep better at night.  Get out of bed if you are still awake after 15 minutes of trying to sleep. Keep the lights down, but try reading or  doing a quiet activity. When you feel sleepy, go back to bed. General instructions  Take over-the-counter and prescription medicines only as told by your health care provider.  Exercise regularly, as told by your health care provider. Avoid exercise starting several hours before bedtime.  Use relaxation techniques to manage stress. Ask your health care provider to suggest some techniques that may work well for you. These may include: ? Breathing exercises. ? Routines to release muscle tension. ? Visualizing peaceful scenes.  Make sure that you drive carefully. Avoid driving if you feel very sleepy.  Keep all follow-up visits as told by your health care provider. This is important. Contact a health care provider if:  You are tired throughout the day.  You have trouble in your daily routine due to sleepiness.  You continue to have sleep problems, or your sleep problems get worse. Get help right away if:  You have serious thoughts about hurting yourself or someone else. If you ever feel like you may hurt yourself or others, or have thoughts about taking your own life, get help right away. You can go to your nearest emergency department or call:  Your local emergency services (911 in the [place]  A suicide crisis helpline, such as the National Suicide Prevention Lifeline at 1-800-273-8255. This is open 24 hours a day. Summary  Insomnia is a sleep disorder that makes it difficult to fall asleep or stay asleep.  Insomnia can be long-term (chronic) or short-term (acute).  Treatment for insomnia depends on the cause. Treatment may focus on treating an underlying condition that is causing insomnia.  Keep a sleep diary to help you and your health care provider figure out what could be causing your insomnia. This information is not intended to replace advice given to you by your health care provider. Make sure you discuss any questions you have with your health care provider. Document  Released: 09/20/2000 Document Revised: 09/05/2017 Document Reviewed: 07/03/2017 Elsevier Patient Education  2020 Else[employer] Steps to Quit Smoking Smoking tobacco is the leading cause of preventable death. It can affect almost every organ in the body. Smoking puts you and people around you at risk for many serious, long-lasting (chronic) diseases. Quitting smoking can be hard, but it is one of the best things that you can do for your health. It is never too late to quit. How do I get ready to quit? When you decide to quit smoking, make a plan to help you succeed. Before you quit:  Pick a date to quit. Set a date within the next 2 weeks to give you time to prepare.  Write down the reasons why you are quitting. Keep this list in places where you will see it often.  Tell your family, friends, and co-workers that you are quitting. Their support is important.  Talk with your doctor about the choices that may help you quit.  Find out if your health insurance will pay for these treatments.  Know the people, places, things, and activities that make you want to smoke (triggers). Avoid them. What first steps can I take to quit smoking?  Throw away all cigarettes at home, at work, and in your   your car.  Throw away the things that you use when you smoke, such as ashtrays and lighters.  Clean your car. Make sure to empty the ashtray.  Clean your home, including curtains and carpets. What can I do to help me quit smoking? Talk with your doctor about taking medicines and seeing a counselor at the same time. You are more likely to succeed when you do both.  If you are pregnant or breastfeeding, talk with your doctor about counseling or other ways to quit smoking. Do not take medicine to help you quit smoking unless your doctor tells you to do so. To quit smoking: Quit right away  Quit smoking totally, instead of slowly cutting back on how much you smoke over a period of time.  Go to counseling.  You are more likely to quit if you go to counseling sessions regularly. Take medicine You may take medicines to help you quit. Some medicines need a prescription, and some you can buy over-the-counter. Some medicines may contain a drug called nicotine to replace the nicotine in cigarettes. Medicines may:  Help you to stop having the desire to smoke (cravings).  Help to stop the problems that come when you stop smoking (withdrawal symptoms). Your doctor may ask you to use:  Nicotine patches, gum, or lozenges.  Nicotine inhalers or sprays.  Non-nicotine medicine that is taken by mouth. Find resources Find resources and other ways to help you quit smoking and remain smoke-free after you quit. These resources are most helpful when you use them often. They include:  Online chats with a Veterinary surgeoncounselor.  Phone quitlines.  Printed Materials engineerself-help materials.  Support groups or group counseling.  Text messaging programs.  Mobile phone apps. Use apps on your mobile phone or tablet that can help you stick to your quit plan. There are many free apps for mobile phones and tablets as well as websites. Examples include Quit Guide from the [employer] and smokefree.gov  What things can I do to make it easier to quit?   Talk to your family and friends. Ask them to support and encourage you.  Call a phone quitline (1-800-QUIT-NOW), reach out to support groups, or work with a Veterinary surgeoncounselor.  Ask people who smoke to not smoke around you.  Avoid places that make you want to smoke, such as: ? Bars. ? Parties. ? Smoke-break areas at work.  Spend time with people who do not smoke.  Lower the stress in your life. Stress can make you want to smoke. Try these things to help your stress: ? Getting regular exercise. ? Doing deep-breathing exercises. ? Doing yoga. ? Meditating. ? Doing a body scan. To do this, close your eyes, focus on one area of your body at a time from head to toe. Notice which parts of your body are  tense. Try to relax the muscles in those areas. How will I feel when I quit smoking? Day 1 to 3 weeks Within the first 24 hours, you may start to have some problems that come from quitting tobacco. These problems are very bad 2-3 days after you quit, but they do not often last for more than 2-3 weeks. You may get these symptoms:  Mood swings.  Feeling restless, nervous, angry, or annoyed.  Trouble concentrating.  Dizziness.  Strong desire for high-sugar foods and nicotine.  Weight gain.  Trouble pooping (constipation).  Feeling like you may vomit (nausea).  Coughing or a sore throat.  Changes in how the medicines that you take  for other issues work in Public relations account executive.  Depression.  Trouble sleeping (insomnia). Week 3 and afterward After the first 2-3 weeks of quitting, you may start to notice more positive results, such as:  Better sense of smell and taste.  Less coughing and sore throat.  Slower heart rate.  Lower blood pressure.  Clearer skin.  Better breathing.  Fewer sick days. Quitting smoking can be hard. Do not give up if you fail the first time. Some people need to try a few times before they succeed. Do your best to stick to your quit plan, and talk with your doctor if you have any questions or concerns. Summary  Smoking tobacco is the leading cause of preventable death. Quitting smoking can be hard, but it is one of the best things that you can do for your health.  When you decide to quit smoking, make a plan to help you succeed.  Quit smoking right away, not slowly over a period of time.  When you start quitting, seek help from your doctor, family, or friends. This information is not intended to replace advice given to you by your health care provider. Make sure you discuss any questions you have with your health care provider. Document Released: 07/20/2009 Document Revised: 12/11/2018 Document Reviewed: 12/12/2018 Elsevier Patient Education  2020  [employer].

## 2019-09-30 ENCOUNTER — Encounter: Payer: Self-pay | Admitting: Family Medicine

## 2019-09-30 LAB — T4 AND TSH
T4, Total: 9.8 ug/dL (ref 4.5–12.0)
TSH: 1.46 u[IU]/mL (ref 0.450–4.500)

## 2019-09-30 LAB — SURGICAL PATHOLOGY

## 2019-10-12 ENCOUNTER — Telehealth: Payer: Self-pay | Admitting: *Deleted

## 2019-10-12 NOTE — Telephone Encounter (Signed)
Patient verified DOB Patient is aware of FU CT being scheduled for 1/12 at 3:15pm.

## 2019-10-19 ENCOUNTER — Ambulatory Visit (HOSPITAL_COMMUNITY): Payer: No Typology Code available for payment source

## 2019-10-20 ENCOUNTER — Ambulatory Visit: Payer: 59 | Admitting: Pharmacist

## 2019-10-25 ENCOUNTER — Ambulatory Visit (HOSPITAL_COMMUNITY): Payer: Managed Care, Other (non HMO)

## 2019-10-27 ENCOUNTER — Telehealth: Payer: Self-pay | Admitting: Family Medicine

## 2019-10-27 NOTE — Telephone Encounter (Signed)
Patient called saying she has a CT scan on Friday, 10/29/19 and was called by the pre service center saying that they need prior auth. Please f/u

## 2019-10-28 NOTE — Telephone Encounter (Signed)
Proir auth for CT Chest was approved-  Authorization number - [id number]  Heather E. With [employer] aware and will vailidate with insurance company.

## 2019-10-29 ENCOUNTER — Ambulatory Visit (HOSPITAL_COMMUNITY): Payer: Managed Care, Other (non HMO)

## 2019-11-11 ENCOUNTER — Other Ambulatory Visit: Payer: Self-pay

## 2019-11-11 ENCOUNTER — Ambulatory Visit (HOSPITAL_COMMUNITY)
Admission: RE | Admit: 2019-11-11 | Discharge: 2019-11-11 | Disposition: A | Discharge status: Home / Self Care | Payer: No Typology Code available for payment source | Source: Ambulatory Visit | Attending: Family Medicine | Admitting: Family Medicine

## 2019-11-11 DIAGNOSIS — R911 Solitary pulmonary nodule: Secondary | ICD-10-CM | POA: Insufficient documentation

## 2019-12-15 ENCOUNTER — Other Ambulatory Visit: Payer: Self-pay

## 2019-12-15 ENCOUNTER — Ambulatory Visit: Payer: No Typology Code available for payment source | Attending: Family Medicine | Admitting: Physician Assistant

## 2019-12-15 VITALS — BP 128/83 | HR 77 | Temp 98.6°F | Resp 16 | Wt 168.0 lb

## 2019-12-15 DIAGNOSIS — H6981 Other specified disorders of Eustachian tube, right ear: Secondary | ICD-10-CM

## 2019-12-15 MED ORDER — FLUTICASONE PROPIONATE 50 MCG/ACT NA SUSP: 2.0000 | Freq: Every day | NASAL | 6 refills | Status: AC

## 2019-12-15 MED FILL — FLUTICASONE PROP 50 MCG SPR: 50 | 30 days supply | Qty: 16 | Fill #0

## 2019-12-15 NOTE — Progress Notes (Signed)
Tasha Hoover, is a 39 y.o. female  KVQ:[id number]  IEP:[id number]  DOB - 1980-12-03  Subjective:  Chief Complaint and HPI: Tasha Hoover is a 39 y.o. female here today for R ear congestion and pressure.  It has been going on for ~5 days.  Initially it was more painful than it is now.  Some allergy issues this time of year.  No cough or fever.     ROS:   Constitutional:  No f/c, No night sweats, No unexplained weight loss. EENT:  No vision changes, No blurry vision, No hearing changes. No mouth, throat, or other ear problems.  Respiratory: No cough, No SOB Cardiac: No CP, no palpitations GI:  No abd pain, No N/V/D. GU: No Urinary s/sx Musculoskeletal: No joint pain Neuro: No headache, no dizziness, no motor weakness.  Skin: No rash Endocrine:  No polydipsia. No polyuria.  Psych: Denies SI/HI  No problems updated.  ALLERGIES: Allergies  Allergen Reactions  . Vancomycin Hives    PAST MEDICAL HISTORY: Past Medical History:  Diagnosis Date  . Anxiety   . CAP (community acquired pneumonia)   . Depression   . History of kidney stones   . Hyperlipidemia   . Hypothyroidism   . Pneumonia   . Pulmonary nodule    per CT 07/2018  . Tobacco use     MEDICATIONS AT HOME: Prior to Admission medications   Medication Sig Start Date End Date Taking? Authorizing Provider  albuterol (PROVENTIL HFA;VENTOLIN HFA) 108 (90 Base) MCG/ACT inhaler Inhale 2 puffs into the lungs every 6 (six) hours as needed for wheezing or shortness of breath.     [provider]  ALPRAZolam Duanne Moron) 1 MG tablet Take 1 mg by mouth 2 (two) times daily as needed for anxiety.     [provider]  buPROPion (WELLBUTRIN SR) 100 MG 12 hr tablet Take 200 mg by mouth daily.    [provider]  fluticasone (FLONASE) 50 MCG/ACT nasal spray Place 2 sprays into both nostrils daily. 12/15/19   Argentina Donovan, PA-C  ibuprofen (ADVIL,MOTRIN) 200 MG tablet Take 800 mg by mouth 2 (two)  times daily as needed (pain).     [provider]  levothyroxine (SYNTHROID, LEVOTHROID) 50 MCG tablet Take 50 mcg by mouth daily before breakfast.    [provider]  norethindrone-ethinyl estradiol-iron (JUNEL FE 1.5/30) 1.5-30 MG-MCG tablet Take 1 tablet by mouth daily.    [provider]  sertraline (ZOLOFT) 25 MG tablet Take 25 mg by mouth daily.    [provider]  zolpidem (AMBIEN) 5 MG tablet Take 1 tablet (5 mg total) by mouth at bedtime. 09/29/19 10/29/19  Fulp, Cammie, MD     Objective:  EXAM:   Vitals:   12/15/19 1511  BP: 128/83  Pulse: 77  Resp: 16  Temp: 98.6 F (37 C)  SpO2: 99%  Weight: 168 lb (76.2 kg)    General appearance : A&OX3. NAD. Non-toxic-appearing HEENT: Atraumatic and Normocephalic.  PERRLA. EOM intact.  TM full on R w/o erythema. L TM WNL Mouth-MMM, post pharynx WNL w/o erythema, No PND. Neck: supple, no JVD. No cervical lymphadenopathy. No thyromegaly Chest/Lungs:  Breathing-non-labored CVS: S1 S2 regular, no murmurs, gallops, rubs Neurology:  CN II-XII grossly intact, Non focal.   Psych:  TP linear. J/I WNL. Normal speech. Appropriate eye contact and affect.  Skin:  No Rash  Data Review No results found for: HGBA1C   Assessment & Plan   1. Eustachian tube  dysfunction, right mucinex D or sudafed.  No infection currently. - fluticasone (FLONASE) 50 MCG/ACT nasal spray; Place 2 sprays into both nostrils daily.  Dispense: 16 g; Refill: 6     Patient have been counseled extensively about nutrition and exercise  Return for keep next appt with PCP.  The patient was given clear instructions to go to ER or return to medical center if symptoms don't improve, worsen or new problems develop. The patient verbalized understanding. The patient was told to call to get lab results if they haven't heard anything in the next week.     Georgian Co, PA-C [facility] [place],  [place] [phone]   12/15/2019, 3:20 PMPatient ID: Tasha Hoover, female   DOB: [birth year], 39 y.o.   MRN: [id number]

## 2019-12-15 NOTE — Patient Instructions (Signed)
Get mucinex D or sudafed to take for the next 2-3 weeks   Eustachian Tube Dysfunction  Eustachian tube dysfunction refers to a condition in which a blockage develops in the narrow passage that connects the middle ear to the back of the nose (eustachian tube). The eustachian tube regulates air pressure in the middle ear by letting air move between the ear and nose. It also helps to drain fluid from the middle ear space. Eustachian tube dysfunction can affect one or both ears. When the eustachian tube does not function properly, air pressure, fluid, or both can build up in the middle ear. What are the causes? This condition occurs when the eustachian tube becomes blocked or cannot open normally. Common causes of this condition include:  Ear infections.  Colds and other infections that affect the nose, mouth, and throat (upper respiratory tract).  Allergies.  Irritation from cigarette smoke.  Irritation from stomach acid coming up into the esophagus (gastroesophageal reflux). The esophagus is the tube that carries food from the mouth to the stomach.  Sudden changes in air pressure, such as from descending in an airplane or scuba diving.  Abnormal growths in the nose or throat, such as: ? Growths that line the nose (nasal polyps). ? Abnormal growth of cells (tumors). ? Enlarged tissue at the back of the throat (adenoids). What increases the risk? You are more likely to develop this condition if:  You smoke.  You are overweight.  You are a child who has: ? Certain birth defects of the mouth, such as cleft palate. ? Large tonsils or adenoids. What are the signs or symptoms? Common symptoms of this condition include:  A feeling of fullness in the ear.  Ear pain.  Clicking or popping noises in the ear.  Ringing in the ear.  Hearing loss.  Loss of balance.  Dizziness. Symptoms may get worse when the air pressure around you changes, such as when you travel to an area of high  elevation, fly on an airplane, or go scuba diving. How is this diagnosed? This condition may be diagnosed based on:  Your symptoms.  A physical exam of your ears, nose, and throat.  Tests, such as those that measure: ? The movement of your eardrum (tympanogram). ? Your hearing (audiometry). How is this treated? Treatment depends on the cause and severity of your condition.  In mild cases, you may relieve your symptoms by moving air into your ears. This is called "popping the ears."  In more severe cases, or if you have symptoms of fluid in your ears, treatment may include: ? Medicines to relieve congestion (decongestants). ? Medicines that treat allergies (antihistamines). ? Nasal sprays or ear drops that contain medicines that reduce swelling (steroids). ? A procedure to drain the fluid in your eardrum (myringotomy). In this procedure, a small tube is placed in the eardrum to:  Drain the fluid.  Restore the air in the middle ear space. ? A procedure to insert a balloon device through the nose to inflate the opening of the eustachian tube (balloon dilation). Follow these instructions at home: Lifestyle  Do not do any of the following until your health care provider approves: ? Travel to high altitudes. ? Fly in airplanes. ? Work in a Pension scheme manager or room. ? Scuba dive.  Do not use any products that contain nicotine or tobacco, such as cigarettes and e-cigarettes. If you need help quitting, ask your health care provider.  Keep your ears dry. Wear fitted earplugs  during showering and bathing. Dry your ears completely after. General instructions  Take over-the-counter and prescription medicines only as told by your health care provider.  Use techniques to help pop your ears as recommended by your health care provider. These may include: ? Chewing gum. ? Yawning. ? Frequent, forceful swallowing. ? Closing your mouth, holding your nose closed, and gently blowing as if  you are trying to blow air out of your nose.  Keep all follow-up visits as told by your health care provider. This is important. Contact a health care provider if:  Your symptoms do not go away after treatment.  Your symptoms come back after treatment.  You are unable to pop your ears.  You have: ? A fever. ? Pain in your ear. ? Pain in your head or neck. ? Fluid draining from your ear.  Your hearing suddenly changes.  You become very dizzy.  You lose your balance. Summary  Eustachian tube dysfunction refers to a condition in which a blockage develops in the eustachian tube.  It can be caused by ear infections, allergies, inhaled irritants, or abnormal growths in the nose or throat.  Symptoms include ear pain, hearing loss, or ringing in the ears.  Mild cases are treated with maneuvers to unblock the ears, such as yawning or ear popping.  Severe cases are treated with medicines. Surgery may also be done (rare). This information is not intended to replace advice given to you by your health care provider. Make sure you discuss any questions you have with your health care provider. Document Revised: 01/13/2018 Document Reviewed: 01/13/2018 Elsevier Patient Education  2020 [employer].

## 2019-12-29 ENCOUNTER — Ambulatory Visit: Payer: No Typology Code available for payment source | Attending: Family Medicine | Admitting: Family Medicine

## 2019-12-29 ENCOUNTER — Other Ambulatory Visit: Payer: Self-pay

## 2019-12-29 ENCOUNTER — Encounter: Payer: Self-pay | Admitting: Family Medicine

## 2019-12-29 VITALS — BP 124/81 | HR 82 | Temp 98.8°F | Ht 62.0 in | Wt 165.8 lb

## 2019-12-29 DIAGNOSIS — F5104 Psychophysiologic insomnia: Secondary | ICD-10-CM | POA: Diagnosis not present

## 2019-12-29 DIAGNOSIS — F411 Generalized anxiety disorder: Secondary | ICD-10-CM | POA: Diagnosis not present

## 2019-12-29 DIAGNOSIS — E039 Hypothyroidism, unspecified: Secondary | ICD-10-CM | POA: Diagnosis not present

## 2019-12-29 DIAGNOSIS — E785 Hyperlipidemia, unspecified: Secondary | ICD-10-CM

## 2019-12-29 MED ORDER — SERTRALINE HCL 25 MG PO TABS: 25.0000 mg | ORAL_TABLET | Freq: Every day | ORAL | 1 refills | Status: DC

## 2019-12-29 MED ORDER — ZOLPIDEM TARTRATE 5 MG PO TABS: 5.0000 mg | ORAL_TABLET | Freq: Every day | ORAL | 1 refills | Status: DC

## 2019-12-29 NOTE — Progress Notes (Signed)
Subjective:  Patient ID: Tasha Hoover, female    DOB: September 29, 1981  Age: 39 y.o. MRN: [id number]  CC: No chief complaint on file.   HPI Tasha Hoover, 39 year old female,  who is seen in follow-up of chronic medical issues including hypothyroidism, generalized anxiety disorder, chronic insomnia and hyperlipidemia.  She reports that she felt as if she gained about 20 to 30 pounds after she initially started the use of thyroid medication in the past therefore she reports that prior to her last visit, she stopped the use of her thyroid medication and her thyroid blood work was normal therefore she has not been taking any medication for treatment of hypothyroidism.  She has noticed some fatigue/decreased energy.  She denies any swelling/peripheral edema.  She denies any constipation but has had some diarrhea/stomach upset.  She is not sure if the stomach upset is related to taking two  Advil PM at night along with Ambien 5 mg to help with sleep.  She reports that her prior provider initially had her on Ambien 10 mg then she was placed on trazodone and was taking 75 mg at bedtime and she was later switched to Ambien 5 mg.  She does not actually have any pain but takes the ibuprofen PM to help her sleep along with the Ambien.  She does not feel as if she is excessively sedated.  She continues to take Zoloft and she feels that this is working.  She is no longer taking Wellbutrin.  She reports that she was diagnosed in the past with hyperlipidemia and was told to take medication for her cholesterol but she has not been taking any medication.  She actually is fasting at today's visit and would like to have recheck of her cholesterol.  Past Medical History:  Diagnosis Date  . Anxiety   . CAP (community acquired pneumonia)   . Depression   . History of kidney stones   . Hyperlipidemia   . Hypothyroidism   . Pneumonia   . Pulmonary nodule    per CT 07/2018  . Tobacco use     Past Surgical  History:  Procedure Laterality Date  . CERVICAL CONIZATION W/BX N/A 09/24/2019   Procedure: CONIZATION CERVIX WITH BIOPSY;  Surgeon: Essie Hart, MD;  Location: [facility];  Service: Gynecology;  Laterality: N/A;  . CESAREAN SECTION    . HAND SURGERY Left     Family History  Problem Relation Age of Onset  . CAD Maternal Grandfather     Social History   Tobacco Use  . Smoking status: Current Every Day Smoker    Packs/day: 1.00    Years: 20.00    Pack years: 20.00    Types: Cigarettes  . Smokeless tobacco: Never Used  Substance Use Topics  . Alcohol use: No    ROS Review of Systems  HENT: Negative for sore throat and trouble swallowing.   Respiratory: Negative for cough and shortness of breath.   Cardiovascular: Negative for chest pain, palpitations and leg swelling.  Gastrointestinal: Negative for abdominal pain, blood in stool, constipation, diarrhea and nausea.  Endocrine: Negative for polydipsia, polyphagia and polyuria.  Genitourinary: Negative for dysuria and frequency.  Musculoskeletal: Negative for arthralgias and back pain.  Skin: Negative for rash and wound.  Neurological: Negative for dizziness and headaches.  Hematological: Negative for adenopathy. Does not bruise/bleed easily.  Psychiatric/Behavioral: Positive for sleep disturbance. Negative for self-injury and suicidal ideas. The patient is not nervous/anxious (controlled with medication).  Objective:   Today's Vitals: BP 124/81 (BP Location: Left Arm, Patient Position: Sitting, Cuff Size: Large)   Pulse 82   Temp 98.8 F (37.1 C) (Oral)   Ht 5\' 2"  (1.575 m)   Wt 165 lb 12.8 oz (75.2 kg)   SpO2 99%   BMI 30.33 kg/m   Physical Exam Vitals and nursing note reviewed.  Constitutional:      Appearance: Normal appearance.     Comments: Well-nourished well-developed overweight for height female in no acute distress wearing mask as per office COVID-19 precautions  Neck:     Vascular: No  carotid bruit.  Cardiovascular:     Rate and Rhythm: Normal rate and regular rhythm.  Pulmonary:     Effort: Pulmonary effort is normal.     Breath sounds: Normal breath sounds.  Abdominal:     Palpations: Abdomen is soft.     Tenderness: There is no abdominal tenderness. There is no right CVA tenderness, left CVA tenderness, guarding or rebound.  Musculoskeletal:        General: No tenderness.     Cervical back: Normal range of motion and neck supple. No tenderness.     Right lower leg: No edema.     Left lower leg: No edema.  Lymphadenopathy:     Cervical: No cervical adenopathy.  Skin:    General: Skin is warm and dry.  Neurological:     General: No focal deficit present.     Mental Status: She is alert and oriented to person, place, and time.  Psychiatric:        Mood and Affect: Mood normal.        Behavior: Behavior normal.     Assessment & Plan:  1. Hypothyroidism, unspecified type She has a history of hypothyroidism but has not been on medication.  We will recheck thyroid panel with TSH at today's visit and she will be notified if she needs to restart medication based on today's lab work. - Thyroid Panel With TSH  2. Chronic insomnia Refill provided of patient's Ambien for treatment of chronic insomnia.  Discussed whether or not patient wanted to discontinue the Ambien and take trazodone which she had been on in the past as she is currently also taking ibuprofen PM.  Discussed with the patient that if she did not need ibuprofen for pain at bedtime that she should discontinue the ibuprofen patient and otherwise take diphenhydramine which is the sleep ingredient and ibuprofen PM. - zolpidem (AMBIEN) 5 MG tablet; Take 1 tablet (5 mg total) by mouth at bedtime.  Dispense: 90 tablet; Refill: 1  3. GAD (generalized anxiety disorder) Refill provided for sertraline 25 mg as patient was generalized anxiety disorder and she reports that this dose seems to be working well for  her. - sertraline (ZOLOFT) 25 MG tablet; Take 1 tablet (25 mg total) by mouth at bedtime.  Dispense: 90 tablet; Refill: 1  4. Hyperlipidemia, unspecified hyperlipidemia type She is fasting at today's visit and patient reports history of hyperlipidemia for which she has not been taking medication.  Will check lipid panel at today's visit and educational material on hyperlipidemia provided as part of AVS/after visit summary. - Lipid panel   Outpatient Encounter Medications as of 12/29/2019  Medication Sig  . albuterol (PROVENTIL HFA;VENTOLIN HFA) 108 (90 Base) MCG/ACT inhaler Inhale 2 puffs into the lungs every 6 (six) hours as needed for wheezing or shortness of breath.   . fluticasone (FLONASE) 50 MCG/ACT nasal spray Place 2  sprays into both nostrils daily.  Marland Kitchen ibuprofen (ADVIL,MOTRIN) 200 MG tablet Take 800 mg by mouth 2 (two) times daily as needed (pain).   . norethindrone-ethinyl estradiol-iron (JUNEL FE 1.5/30) 1.5-30 MG-MCG tablet Take 1 tablet by mouth daily.  . sertraline (ZOLOFT) 25 MG tablet Take 25 mg by mouth daily.  Marland Kitchen ALPRAZolam (XANAX) 1 MG tablet Take 1 mg by mouth 2 (two) times daily as needed for anxiety.   Marland Kitchen buPROPion (WELLBUTRIN SR) 100 MG 12 hr tablet Take 200 mg by mouth daily.  Marland Kitchen levothyroxine (SYNTHROID, LEVOTHROID) 50 MCG tablet Take 50 mcg by mouth daily before breakfast.  . zolpidem (AMBIEN) 5 MG tablet Take 1 tablet (5 mg total) by mouth at bedtime.   No facility-administered encounter medications on file as of 12/29/2019.    An After Visit Summary was printed and given to the patient.   Follow-up: Return in about 4 months (around 04/29/2020) for chronic issues; sooner if needed.   Antony Blackbird MD

## 2019-12-29 NOTE — Patient Instructions (Signed)

## 2019-12-29 NOTE — Progress Notes (Signed)
Med refills  3 mo f/u

## 2019-12-30 ENCOUNTER — Other Ambulatory Visit: Payer: Self-pay | Admitting: Family Medicine

## 2019-12-30 DIAGNOSIS — E782 Mixed hyperlipidemia: Secondary | ICD-10-CM

## 2019-12-30 DIAGNOSIS — Z79899 Other long term (current) drug therapy: Secondary | ICD-10-CM

## 2019-12-30 LAB — LIPID PANEL
Chol/HDL Ratio: 6.7 ratio — ABNORMAL HIGH (ref 0.0–4.4)
Cholesterol, Total: 263 mg/dL — ABNORMAL HIGH (ref 100–199)
HDL: 39 mg/dL — ABNORMAL LOW
LDL Chol Calc (NIH): 156 mg/dL — ABNORMAL HIGH (ref 0–99)
Triglycerides: 363 mg/dL — ABNORMAL HIGH (ref 0–149)
VLDL Cholesterol Cal: 68 mg/dL — ABNORMAL HIGH (ref 5–40)

## 2019-12-30 LAB — THYROID PANEL WITH TSH
Free Thyroxine Index: 2 (ref 1.2–4.9)
T3 Uptake Ratio: 19 % — ABNORMAL LOW (ref 24–39)
T4, Total: 10.3 ug/dL (ref 4.5–12.0)
TSH: 2.05 u[IU]/mL (ref 0.450–4.500)

## 2019-12-30 MED ORDER — ROSUVASTATIN CALCIUM 40 MG PO TABS: 40.0000 mg | ORAL_TABLET | Freq: Every day | ORAL | 5 refills | Status: DC

## 2020-01-06 ENCOUNTER — Encounter: Payer: Self-pay | Admitting: *Deleted

## 2020-02-25 DIAGNOSIS — Z20822 Contact with and (suspected) exposure to covid-19: Secondary | ICD-10-CM | POA: Insufficient documentation

## 2020-07-11 ENCOUNTER — Telehealth: Payer: Self-pay | Admitting: Family Medicine

## 2020-07-11 DIAGNOSIS — F411 Generalized anxiety disorder: Secondary | ICD-10-CM

## 2020-07-11 DIAGNOSIS — E782 Mixed hyperlipidemia: Secondary | ICD-10-CM

## 2020-07-11 NOTE — Telephone Encounter (Signed)
Requested Prescriptions  Pending Prescriptions Disp Refills  . sertraline (ZOLOFT) 25 MG tablet [Pharmacy Med Name: SERTRALINE HCL 25 MG TABLET] 30 tablet 0    Sig: TAKE 1 TABLET BY MOUTH EVERYDAY AT BEDTIME     Psychiatry:  Antidepressants - SSRI Failed - 07/11/2020 12:08 AM      Failed - Valid encounter within last 6 months    Recent Outpatient Visits          6 months ago Hypothyroidism, unspecified type   [facility] Fulp, [place], MD   6 months ago Eustachian tube dysfunction, right   [facility] [place], [place], [place]   9 months ago Hypothyroidism, unspecified type   [facility] Fulp, [place], MD             . rosuvastatin (CRESTOR) 40 MG tablet [Pharmacy Med Name: ROSUVASTATIN CALCIUM 40 MG TAB] 90 tablet 1    Sig: TAKE 1 TABLET (40 MG TOTAL) BY MOUTH DAILY. TO LOWER LIPIDS     Cardiovascular:  Antilipid - Statins Failed - 07/11/2020 12:08 AM      Failed - Total Cholesterol in normal range and within 360 days    Cholesterol, Total  Date Value Ref Range Status  12/29/2019 263 (H) 100 - 199 mg/dL Final         Failed - LDL in normal range and within 360 days    LDL Chol Calc (NIH)  Date Value Ref Range Status  12/29/2019 156 (H) 0 - 99 mg/dL Final         Failed - HDL in normal range and within 360 days    HDL  Date Value Ref Range Status  12/29/2019 39 (L) >39 mg/dL Final         Failed - Triglycerides in normal range and within 360 days    Triglycerides  Date Value Ref Range Status  12/29/2019 363 (H) 0 - 149 mg/dL Final         Passed - Patient is not pregnant      Passed - Valid encounter within last 12 months    Recent Outpatient Visits          6 months ago Hypothyroidism, unspecified type   [facility] [place], [place], [place]   6 months ago Eustachian tube dysfunction, right   [facility] [place], [place],  [place]   9 months ago Hypothyroidism, unspecified type   [facility] Fulp, [place], MD             One month courtesy refill per protocol with reminder for patient to schedule an appointment for an office visit.

## 2020-07-11 NOTE — Telephone Encounter (Signed)
If patient was a 30 day courtesy refill then she just needs to be notified to make a follow-up appointment

## 2020-07-20 ENCOUNTER — Other Ambulatory Visit: Payer: Self-pay | Admitting: Family Medicine

## 2020-07-20 DIAGNOSIS — F5104 Psychophysiologic insomnia: Secondary | ICD-10-CM

## 2020-07-20 NOTE — Telephone Encounter (Signed)
Requested medication (s) are due for refill today: yes  Requested medication (s) are on the active medication list: yes  Last refill:12/29/19 #90  1 refill  Future visit scheduled  yes 07/24/20  Notes to clinic:not delegated  Requested Prescriptions  Pending Prescriptions Disp Refills   zolpidem (AMBIEN) 5 MG tablet [Pharmacy Med Name: ZOLPIDEM TARTRATE 5 MG TABLET] 90 tablet     Sig: TAKE 1 TABLET BY MOUTH EVERYDAY AT BEDTIME      Not Delegated - Psychiatry:  Anxiolytics/Hypnotics Failed - 07/20/2020  2:50 PM      Failed - This refill cannot be delegated      Failed - Urine Drug Screen completed in last 360 days.      Failed - Valid encounter within last 6 months    Recent Outpatient Visits           6 months ago Hypothyroidism, unspecified type   [facility] Fulp, [place], MD   7 months ago Eustachian tube dysfunction, right   [facility] [place], [place], [place]   9 months ago Hypothyroidism, unspecified type   [facility] Fulp, [place], MD       Future Appointments             In 4 days Cain Saupe, MD [facility]

## 2020-07-24 ENCOUNTER — Ambulatory Visit: Payer: No Typology Code available for payment source | Attending: Family Medicine | Admitting: Family Medicine

## 2020-07-24 ENCOUNTER — Other Ambulatory Visit: Payer: Self-pay

## 2020-07-24 ENCOUNTER — Encounter: Payer: Self-pay | Admitting: Family Medicine

## 2020-07-24 VITALS — BP 112/68 | HR 89 | Temp 97.3°F | Ht 63.0 in | Wt 176.2 lb

## 2020-07-24 DIAGNOSIS — F411 Generalized anxiety disorder: Secondary | ICD-10-CM

## 2020-07-24 DIAGNOSIS — Z8639 Personal history of other endocrine, nutritional and metabolic disease: Secondary | ICD-10-CM

## 2020-07-24 DIAGNOSIS — J441 Chronic obstructive pulmonary disease with (acute) exacerbation: Secondary | ICD-10-CM

## 2020-07-24 DIAGNOSIS — F5104 Psychophysiologic insomnia: Secondary | ICD-10-CM

## 2020-07-24 DIAGNOSIS — E782 Mixed hyperlipidemia: Secondary | ICD-10-CM | POA: Diagnosis not present

## 2020-07-24 DIAGNOSIS — F17210 Nicotine dependence, cigarettes, uncomplicated: Secondary | ICD-10-CM

## 2020-07-24 DIAGNOSIS — J069 Acute upper respiratory infection, unspecified: Secondary | ICD-10-CM

## 2020-07-24 DIAGNOSIS — Z79899 Other long term (current) drug therapy: Secondary | ICD-10-CM

## 2020-07-24 MED ORDER — PREDNISONE 20 MG PO TABS: ORAL_TABLET | ORAL | 0 refills | Status: DC

## 2020-07-24 MED ORDER — AZITHROMYCIN 250 MG PO TABS: ORAL_TABLET | ORAL | 0 refills | Status: DC

## 2020-07-24 MED ORDER — ZOLPIDEM TARTRATE 5 MG PO TABS: 5.0000 mg | ORAL_TABLET | Freq: Every day | ORAL | 1 refills | Status: DC

## 2020-07-24 MED ORDER — ALBUTEROL SULFATE HFA 108 (90 BASE) MCG/ACT IN AERS: 2.0000 | INHALATION_SPRAY | Freq: Four times a day (QID) | RESPIRATORY_TRACT | 11 refills | Status: AC | PRN

## 2020-07-24 NOTE — Progress Notes (Signed)
Pt thinks she is having Bronchitis flare

## 2020-07-24 NOTE — Progress Notes (Signed)
Established Patient Office Visit  Subjective:  Patient ID: Tasha Hoover, female    DOB: 09/06/1981  Age: 39 y.o. MRN: [id number]  CC:  Chief Complaint  Patient presents with  . Follow-up    HPI Tasha Calamityamela A Oquin 39 year old female who reports that she was told that she needed an appointment in order to receive refills for her Ambien for treatment of her chronic insomnia and for follow-up of chronic issues however she developed cough productive of beige sputum, wheezing which is worse at night, nasal congestion with postnasal drainage and ear pressure at the end of last week but decided to wait until her visit today rather than going to urgent care over the weekend.  She denies any fever or chills.  She has not had COVID-19 immunization.  She is nonfasting at today's visit.  She reports that she still has refills of her cholesterol medication.  She does need refill of Ambien for chronic insomnia.  She reports that she continues to take her sertraline which is controlling her anxiety and she does not need a refill at this time.  She does continue to smoke approximately 1 pack of cigarettes per day, she does not believe that she can stop smoking at this time..  Past Medical History:  Diagnosis Date  . Anxiety   . CAP (community acquired pneumonia)   . Depression   . History of kidney stones   . Hyperlipidemia   . Hypothyroidism   . Pneumonia   . Pulmonary nodule    per CT 07/2018  . Tobacco use     Past Surgical History:  Procedure Laterality Date  . CERVICAL CONIZATION W/BX N/A 09/24/2019   Procedure: CONIZATION CERVIX WITH BIOPSY;  Surgeon: Essie HartPinn, [place], MD;  Location: [facility];  Service: Gynecology;  Laterality: N/A;  . CESAREAN SECTION    . HAND SURGERY Left     Family History  Problem Relation Age of Onset  . CAD Maternal Grandfather     Social History   Socioeconomic History  . Marital status: Single    Spouse name: Not on file  . Number of  children: Not on file  . Years of education: Not on file  . Highest education level: Not on file  Occupational History  . Not on file  Tobacco Use  . Smoking status: Current Every Day Smoker    Packs/day: 1.00    Years: 20.00    Pack years: 20.00    Types: Cigarettes  . Smokeless tobacco: Never Used  Vaping Use  . Vaping Use: Never used  Substance and Sexual Activity  . Alcohol use: No  . Drug use: Never  . Sexual activity: Yes    Birth control/protection: Pill  Other Topics Concern  . Not on file  Social History Narrative  . Not on file   Social Determinants of Health   Financial Resource Strain:   . Difficulty of Paying Living Expenses: Not on file  Food Insecurity:   . Worried About Programme researcher, broadcasting/film/videounning Out of Food in the Last Year: Not on file  . Ran Out of Food in the Last Year: Not on file  Transportation Needs:   . Lack of Transportation (Medical): Not on file  . Lack of Transportation (Non-Medical): Not on file  Physical Activity:   . Days of Exercise per Week: Not on file  . Minutes of Exercise per Session: Not on file  Stress:   . Feeling of Stress : Not on file  Social Connections:   . Frequency of Communication with Friends and Family: Not on file  . Frequency of Social Gatherings with Friends and Family: Not on file  . Attends Religious Services: Not on file  . Active Member of Clubs or Organizations: Not on file  . Attends Banker Meetings: Not on file  . Marital Status: Not on file  Intimate Partner Violence:   . Fear of Current or Ex-Partner: Not on file  . Emotionally Abused: Not on file  . Physically Abused: Not on file  . Sexually Abused: Not on file    Outpatient Medications Prior to Visit  Medication Sig Dispense Refill  . fluticasone (FLONASE) 50 MCG/ACT nasal spray Place 2 sprays into both nostrils daily. 16 g 6  . norethindrone-ethinyl estradiol-iron (JUNEL FE 1.5/30) 1.5-30 MG-MCG tablet Take 1 tablet by mouth daily.    . rosuvastatin  (CRESTOR) 40 MG tablet TAKE 1 TABLET (40 MG TOTAL) BY MOUTH DAILY. TO LOWER LIPIDS 90 tablet 1  . albuterol (PROVENTIL HFA;VENTOLIN HFA) 108 (90 Base) MCG/ACT inhaler Inhale 2 puffs into the lungs every 6 (six) hours as needed for wheezing or shortness of breath.     . sertraline (ZOLOFT) 25 MG tablet TAKE 1 TABLET BY MOUTH EVERYDAY AT BEDTIME 30 tablet 0  . zolpidem (AMBIEN) 5 MG tablet Take 1 tablet (5 mg total) by mouth at bedtime. 90 tablet 1  . ALPRAZolam (XANAX) 1 MG tablet Take 1 mg by mouth 2 (two) times daily as needed for anxiety.  (Patient not taking: Reported on 07/24/2020)    . levothyroxine (SYNTHROID, LEVOTHROID) 50 MCG tablet Take 50 mcg by mouth daily before breakfast. (Patient not taking: Reported on 07/24/2020)     No facility-administered medications prior to visit.    Allergies  Allergen Reactions  . Vancomycin Hives    ROS Review of Systems  Constitutional: Positive for fatigue. Negative for chills and fever.  HENT: Positive for congestion, postnasal drip and rhinorrhea. Negative for sore throat and trouble swallowing.        Ear pressure  Eyes: Negative for photophobia and visual disturbance.  Respiratory: Positive for cough, shortness of breath and wheezing.   Cardiovascular: Negative for chest pain and palpitations.  Gastrointestinal: Negative for abdominal pain, constipation, diarrhea and nausea.  Endocrine: Negative for polydipsia, polyphagia and polyuria.  Genitourinary: Negative for dysuria and frequency.  Musculoskeletal: Negative for arthralgias and back pain.  Skin: Negative for rash and wound.  Neurological: Negative for dizziness and headaches.  Hematological: Negative for adenopathy. Does not bruise/bleed easily.  Psychiatric/Behavioral: Positive for sleep disturbance (chronic insomonia). The patient is not nervous/anxious.       Objective:    Physical Exam Vitals and nursing note reviewed.  Constitutional:      General: She is not in acute  distress.    Appearance: Normal appearance.     Comments: WNWD female in NAD wearing mask as per office COVID-19 precautions.  Patient with nasal quality to her voice and occasional episodes of coughing while in exam room  Neck:     Comments: No palpable thyromegaly or thyroid asymmetry Cardiovascular:     Rate and Rhythm: Normal rate and regular rhythm.  Pulmonary:     Effort: Pulmonary effort is normal.     Breath sounds: Wheezing present.  Abdominal:     Palpations: Abdomen is soft.     Tenderness: There is no abdominal tenderness. There is no right CVA tenderness, left CVA tenderness, guarding or rebound.  Musculoskeletal:        General: No tenderness.     Cervical back: Normal range of motion and neck supple. No rigidity or tenderness.     Right lower leg: No edema.     Left lower leg: No edema.  Lymphadenopathy:     Cervical: No cervical adenopathy.  Skin:    General: Skin is warm and dry.  Neurological:     General: No focal deficit present.     Mental Status: She is alert and oriented to person, place, and time.  Psychiatric:        Mood and Affect: Mood normal.        Behavior: Behavior normal.     BP 112/68 (BP Location: Left Arm, Patient Position: Sitting)   Pulse 89   Temp (!) 97.3 F (36.3 C)   Ht 5\' 3"  (1.6 m)   Wt 176 lb 3.2 oz (79.9 kg)   SpO2 99%   BMI 31.21 kg/m  Wt Readings from Last 3 Encounters:  07/24/20 176 lb 3.2 oz (79.9 kg)  12/29/19 165 lb 12.8 oz (75.2 kg)  12/15/19 168 lb (76.2 kg)     Health Maintenance Due  Topic Date Due  . Hepatitis C Screening  Never done  . COVID-19 Vaccine (1) Never done  . PAP SMEAR-Modifier  Never done  . INFLUENZA VACCINE  Never done   Patient has not yet had COVID-19 vaccine-still considering.  Patient declines influenza immunization.   Lab Results  Component Value Date   TSH 2.050 12/29/2019   Lab Results  Component Value Date   WBC 9.8 09/21/2019   HGB 14.9 09/21/2019   HCT 43.4 09/21/2019    MCV 96.7 09/21/2019   PLT 332 09/21/2019   Lab Results  Component Value Date   NA 139 08/05/2018   K 3.9 08/05/2018   CO2 18 (L) 08/05/2018   GLUCOSE 82 08/05/2018   BUN 9 08/05/2018   CREATININE 0.81 08/05/2018   BILITOT 0.3 08/04/2018   ALKPHOS 39 08/04/2018   AST 15 08/04/2018   ALT 12 08/04/2018   PROT 5.5 (L) 08/04/2018   ALBUMIN 3.2 (L) 08/04/2018   CALCIUM 8.5 (L) 08/05/2018   ANIONGAP 12 08/05/2018   Lab Results  Component Value Date   CHOL 263 (H) 12/29/2019   Lab Results  Component Value Date   HDL 39 (L) 12/29/2019   Lab Results  Component Value Date   LDLCALC 156 (H) 12/29/2019   Lab Results  Component Value Date   TRIG 363 (H) 12/29/2019   Lab Results  Component Value Date   CHOLHDL 6.7 (H) 12/29/2019   No results found for: HGBA1C    Assessment & Plan:  1. COPD with acute exacerbation (HCC) Patient with COPD exacerbation for which she is given prescription for prednisone 40 mg daily x 5 days, azithromycin Z-Pak and refill of albuterol inhaler.  She may wish to take an over-the-counter medication such as Robitussin-DM to help with any cough or chest congestion.  If she has any acute worsening of symptoms, she should go to the emergency department for further evaluation and treatment and schedule office follow-up.  Educational material on COPD exacerbation and steps to quit smoking provided to the patient as part of after visit summary at today's visit. - azithromycin (ZITHROMAX) 250 MG tablet; Take 2 pills today then one pill daily for 4 days  Dispense: 6 tablet; Refill: 0 - predniSONE (DELTASONE) 20 MG tablet; 2 pill once per day for  5 days; eat before taking medication  Dispense: 5 tablet; Refill: 0 - albuterol (VENTOLIN HFA) 108 (90 Base) MCG/ACT inhaler; Inhale 2 puffs into the lungs every 6 (six) hours as needed for wheezing or shortness of breath.  Dispense: 1 each; Refill: 11  2. Mixed dyslipidemia Patient will schedule fasting lab visit for  comprehensive metabolic panel and lipid panel in follow-up of mixed dyslipidemia for which she is currently on atorvastatin 40 mg daily. - Comprehensive metabolic panel; Future - Lipid Panel; Future  3. History of hypothyroidism Patient with history of hypothyroidism and is currently not taking medication.  Patient will schedule lab visit and will check thyroid panel with TSH and she will be notified of treatment recommendations based on the results. - Thyroid Panel With TSH; Future  4. Chronic insomnia Discussed with patient that 5 mg of Ambien is the recommended maximum dose for female patients in her age group.  She has noticed some improvement in sleep when she takes melatonin along with Ambien and she may do this if needed.  Refill provided for Ambien at today's visit. - zolpidem (AMBIEN) 5 MG tablet; Take 1 tablet (5 mg total) by mouth at bedtime.  Dispense: 90 tablet; Refill: 1  5. Tobacco dependence due to cigarettes Approximately 5 or more minutes spent with the patient discussing the need for smoking cessation and patient was offered referral to the clinical pharmacist if she would like additional help with smoking cessation.  Patient declines at this time but she is aware that the services available when needed.  Educational material on COPD exacerbation and steps to quit smoking provided as part of after visit summary.  6. URI with cough and congestion Recommended over-the-counter antihistamines and medications for cough and congestion in addition to prescriptions which were provided for treatment of COPD exacerbation.  7. Encounter for long-term current use of medication Patient is to schedule fasting lab appointment for comprehensive metabolic panel in follow-up of long-term use of statin medication as well as use of Ambien and over-the-counter medication use. - Comprehensive metabolic panel; Future  8. GAD (generalized anxiety disorder) Patient with history of generalized  anxiety disorder for which she is currently on Zoloft/sertraline 25 mg daily which she feels is sufficient at this time.  She is no longer taking alprazolam on an as-needed basis.     Follow-up: Return in about 6 months (around 01/22/2021) for chronic issues; next week if not feeling better; schedule fasting lab visit.   Cain Saupe, MD

## 2020-07-24 NOTE — Patient Instructions (Signed)
Chronic Obstructive Pulmonary Disease Exacerbation Chronic obstructive pulmonary disease (COPD) is a long-term (chronic) lung problem. In COPD, the flow of air from the lungs is limited. COPD exacerbations are times that breathing gets worse and you need more than your normal treatment. Without treatment, they can be life threatening. If they happen often, your lungs can become more damaged. If your COPD gets worse, your doctor may treat you with:  Medicines.  Oxygen.  Different ways to clear your airway, such as using a mask. Follow these instructions at home: Medicines  Take over-the-counter and prescription medicines only as told by your doctor.  If you take an antibiotic or steroid medicine, do not stop taking the medicine even if you start to feel better.  Keep up with shots (vaccinations) as told by your doctor. Be sure to get a yearly (annual) flu shot. Lifestyle  Do not smoke. If you need help quitting, ask your doctor.  Eat healthy foods.  Exercise regularly.  Get plenty of sleep.  Avoid tobacco smoke and other things that can bother your lungs.  Wash your hands often with soap and water. This will help keep you from getting an infection. If you cannot use soap and water, use hand sanitizer.  During flu season, avoid areas that are crowded with people. General instructions  Drink enough fluid to keep your pee (urine) clear or pale yellow. Do not do this if your doctor has told you not to.  Use a cool mist machine (vaporizer).  If you use oxygen or a machine that turns medicine into a mist (nebulizer), continue to use it as told.  Follow all instructions for rehabilitation. These are steps you can take to make your body work better.  Keep all follow-up visits as told by your doctor. This is important. Contact a doctor if:  Your COPD symptoms get worse than normal. Get help right away if:  You are short of breath and it gets worse.  You have trouble  talking.  You have chest pain.  You cough up blood.  You have a fever.  You keep throwing up (vomiting).  You feel weak or you pass out (faint).  You feel confused.  You are not able to sleep because of your symptoms.  You are not able to do daily activities. Summary  COPD exacerbations are times that breathing gets worse and you need more treatment than normal.  COPD exacerbations can be very serious and may cause your lungs to become more damaged.  Do not smoke. If you need help quitting, ask your doctor.  Stay up-to-date on your shots. Get a flu shot every year. This information is not intended to replace advice given to you by your health care provider. Make sure you discuss any questions you have with your health care provider. Document Revised: 09/05/2017 Document Reviewed: 10/28/2016 Elsevier Patient Education  2020 [employer].  Steps to Quit Smoking Smoking tobacco is the leading cause of preventable death. It can affect almost every organ in the body. Smoking puts you and people around you at risk for many serious, long-lasting (chronic) diseases. Quitting smoking can be hard, but it is one of the best things that you can do for your health. It is never too late to quit. How do I get ready to quit? When you decide to quit smoking, make a plan to help you succeed. Before you quit:  Pick a date to quit. Set a date within the next 2 weeks to give you  time to prepare.  Write down the reasons why you are quitting. Keep this list in places where you will see it often.  Tell your family, friends, and co-workers that you are quitting. Their support is important.  Talk with your doctor about the choices that may help you quit.  Find out if your health insurance will pay for these treatments.  Know the people, places, things, and activities that make you want to smoke (triggers). Avoid them. What first steps can I take to quit smoking?  Throw away all cigarettes at  home, at work, and in your car.  Throw away the things that you use when you smoke, such as ashtrays and lighters.  Clean your car. Make sure to empty the ashtray.  Clean your home, including curtains and carpets. What can I do to help me quit smoking? Talk with your doctor about taking medicines and seeing a counselor at the same time. You are more likely to succeed when you do both.  If you are pregnant or breastfeeding, talk with your doctor about counseling or other ways to quit smoking. Do not take medicine to help you quit smoking unless your doctor tells you to do so. To quit smoking: Quit right away  Quit smoking totally, instead of slowly cutting back on how much you smoke over a period of time.  Go to counseling. You are more likely to quit if you go to counseling sessions regularly. Take medicine You may take medicines to help you quit. Some medicines need a prescription, and some you can buy over-the-counter. Some medicines may contain a drug called nicotine to replace the nicotine in cigarettes. Medicines may:  Help you to stop having the desire to smoke (cravings).  Help to stop the problems that come when you stop smoking (withdrawal symptoms). Your doctor may ask you to use:  Nicotine patches, gum, or lozenges.  Nicotine inhalers or sprays.  Non-nicotine medicine that is taken by mouth. Find resources Find resources and other ways to help you quit smoking and remain smoke-free after you quit. These resources are most helpful when you use them often. They include:  Online chats with a Veterinary surgeon.  Phone quitlines.  Printed Materials engineer.  Support groups or group counseling.  Text messaging programs.  Mobile phone apps. Use apps on your mobile phone or tablet that can help you stick to your quit plan. There are many free apps for mobile phones and tablets as well as websites. Examples include Quit Guide from the [employer] and smokefree.gov  What things can I  do to make it easier to quit?   Talk to your family and friends. Ask them to support and encourage you.  Call a phone quitline (1-800-QUIT-NOW), reach out to support groups, or work with a Veterinary surgeon.  Ask people who smoke to not smoke around you.  Avoid places that make you want to smoke, such as: ? Bars. ? Parties. ? Smoke-break areas at work.  Spend time with people who do not smoke.  Lower the stress in your life. Stress can make you want to smoke. Try these things to help your stress: ? Getting regular exercise. ? Doing deep-breathing exercises. ? Doing yoga. ? Meditating. ? Doing a body scan. To do this, close your eyes, focus on one area of your body at a time from head to toe. Notice which parts of your body are tense. Try to relax the muscles in those areas. How will I feel when I quit smoking?  Day 1 to 3 weeks Within the first 24 hours, you may start to have some problems that come from quitting tobacco. These problems are very bad 2-3 days after you quit, but they do not often last for more than 2-3 weeks. You may get these symptoms:  Mood swings.  Feeling restless, nervous, angry, or annoyed.  Trouble concentrating.  Dizziness.  Strong desire for high-sugar foods and nicotine.  Weight gain.  Trouble pooping (constipation).  Feeling like you may vomit (nausea).  Coughing or a sore throat.  Changes in how the medicines that you take for other issues work in your body.  Depression.  Trouble sleeping (insomnia). Week 3 and afterward After the first 2-3 weeks of quitting, you may start to notice more positive results, such as:  Better sense of smell and taste.  Less coughing and sore throat.  Slower heart rate.  Lower blood pressure.  Clearer skin.  Better breathing.  Fewer sick days. Quitting smoking can be hard. Do not give up if you fail the first time. Some people need to try a few times before they succeed. Do your best to stick to your quit  plan, and talk with your doctor if you have any questions or concerns. Summary  Smoking tobacco is the leading cause of preventable death. Quitting smoking can be hard, but it is one of the best things that you can do for your health.  When you decide to quit smoking, make a plan to help you succeed.  Quit smoking right away, not slowly over a period of time.  When you start quitting, seek help from your doctor, family, or friends. This information is not intended to replace advice given to you by your health care provider. Make sure you discuss any questions you have with your health care provider. Document Revised: 06/18/2019 Document Reviewed: 12/12/2018 Elsevier Patient Education  2020 [employer].

## 2020-07-25 ENCOUNTER — Encounter: Payer: Self-pay | Admitting: Family Medicine

## 2020-07-25 ENCOUNTER — Telehealth: Payer: Self-pay | Admitting: Family Medicine

## 2020-07-25 NOTE — Telephone Encounter (Signed)
Copied from CRM [id number]. Topic: General - Inquiry >> Jul 25, 2020 11:30 AM Crist Infante wrote: Reason for CRM: pt states she needs a work note stating her cough is due to asthma

## 2020-07-27 NOTE — Telephone Encounter (Signed)
Att to contact pt to advice that letter is available for pickup. No ans lvm for pt

## 2020-08-11 ENCOUNTER — Other Ambulatory Visit: Payer: Self-pay | Admitting: Family Medicine

## 2020-08-11 DIAGNOSIS — F411 Generalized anxiety disorder: Secondary | ICD-10-CM

## 2020-08-11 NOTE — Telephone Encounter (Signed)
Requested Prescriptions  Pending Prescriptions Disp Refills  . sertraline (ZOLOFT) 25 MG tablet [Pharmacy Med Name: SERTRALINE HCL 25 MG TABLET] 90 tablet 0    Sig: TAKE 1 TABLET BY MOUTH EVERYDAY AT BEDTIME     Psychiatry:  Antidepressants - SSRI Passed - 08/11/2020  8:31 AM      Passed - Valid encounter within last 6 months    Recent Outpatient Visits          2 weeks ago COPD with acute exacerbation (HCC)   [facility] [place], [place], [place]   7 months ago Hypothyroidism, unspecified type   [facility] [place], [place], [place]   8 months ago Eustachian tube dysfunction, right   [facility] [place], [place], [place]   10 months ago Hypothyroidism, unspecified type   [facility] [place], [place], [place]

## 2020-08-15 ENCOUNTER — Other Ambulatory Visit: Payer: Self-pay | Admitting: Obstetrics & Gynecology

## 2020-08-15 DIAGNOSIS — N6489 Other specified disorders of breast: Secondary | ICD-10-CM

## 2020-08-18 ENCOUNTER — Other Ambulatory Visit: Payer: Self-pay | Admitting: Obstetrics & Gynecology

## 2020-08-18 DIAGNOSIS — R928 Other abnormal and inconclusive findings on diagnostic imaging of breast: Secondary | ICD-10-CM

## 2020-08-25 ENCOUNTER — Ambulatory Visit
Admission: RE | Admit: 2020-08-25 | Discharge: 2020-08-25 | Disposition: A | Discharge status: Home / Self Care | Payer: No Typology Code available for payment source | Source: Ambulatory Visit | Attending: Obstetrics & Gynecology | Admitting: Obstetrics & Gynecology

## 2020-08-25 ENCOUNTER — Other Ambulatory Visit: Payer: Self-pay

## 2020-08-25 ENCOUNTER — Other Ambulatory Visit: Payer: Self-pay | Admitting: Obstetrics & Gynecology

## 2020-08-25 ENCOUNTER — Ambulatory Visit: Payer: No Typology Code available for payment source

## 2020-08-25 DIAGNOSIS — R928 Other abnormal and inconclusive findings on diagnostic imaging of breast: Secondary | ICD-10-CM

## 2020-10-31 DIAGNOSIS — Z20822 Contact with and (suspected) exposure to covid-19: Secondary | ICD-10-CM | POA: Diagnosis not present

## 2021-02-26 ENCOUNTER — Other Ambulatory Visit: Payer: Self-pay | Admitting: Obstetrics & Gynecology

## 2021-02-26 ENCOUNTER — Other Ambulatory Visit: Payer: Self-pay

## 2021-02-26 ENCOUNTER — Ambulatory Visit
Admission: RE | Admit: 2021-02-26 | Discharge: 2021-02-26 | Disposition: A | Discharge status: Home / Self Care | Payer: No Typology Code available for payment source | Source: Ambulatory Visit | Attending: Obstetrics & Gynecology | Admitting: Obstetrics & Gynecology

## 2021-02-26 DIAGNOSIS — N6312 Unspecified lump in the right breast, upper inner quadrant: Secondary | ICD-10-CM | POA: Diagnosis not present

## 2021-02-26 DIAGNOSIS — R928 Other abnormal and inconclusive findings on diagnostic imaging of breast: Secondary | ICD-10-CM

## 2021-02-26 DIAGNOSIS — N6311 Unspecified lump in the right breast, upper outer quadrant: Secondary | ICD-10-CM | POA: Diagnosis not present

## 2021-03-13 IMAGING — US US BREAST*R* LIMITED INC AXILLA
1 series · 5 of 5 positions shown · non-contrast
Comparison: Previous exam(s).

CLINICAL DATA: 39-year-old female presenting as a recall from
screening for possible right breast mass.

EXAM:
DIGITAL DIAGNOSTIC RIGHT MAMMOGRAM WITH TOMO
ULTRASOUND RIGHT BREAST

[Series 1: us breast*right* limited inc axilla · 0.08mm/px · 5 of 5 slices shown]
[im 1/5]
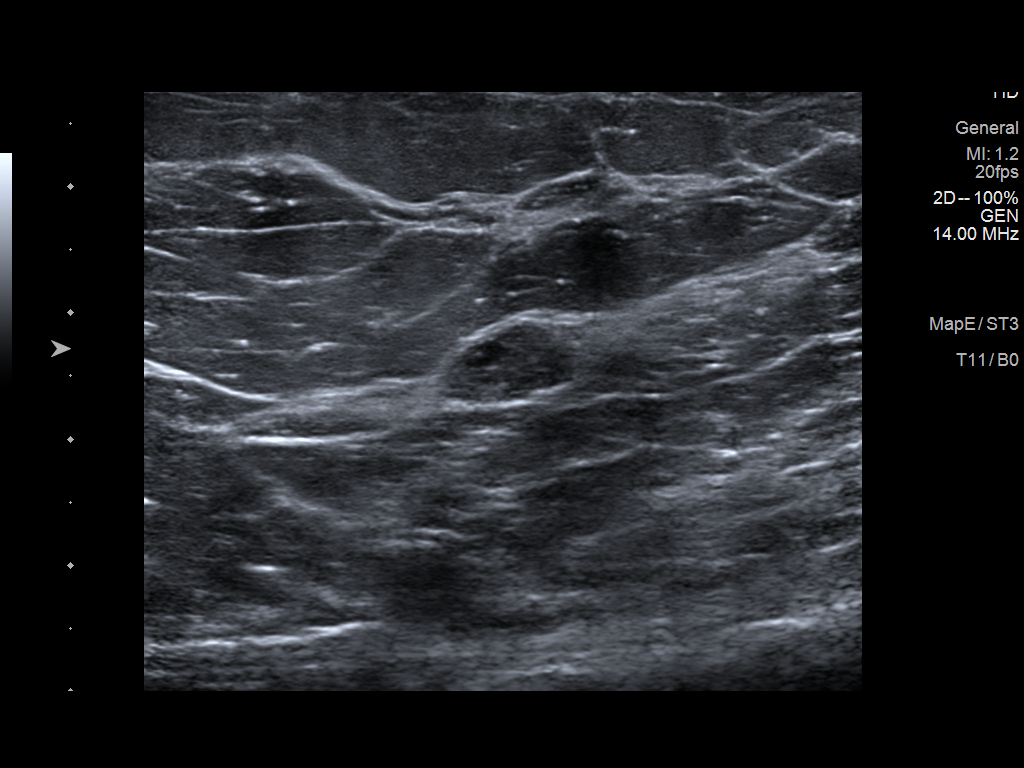
[im 2/5]
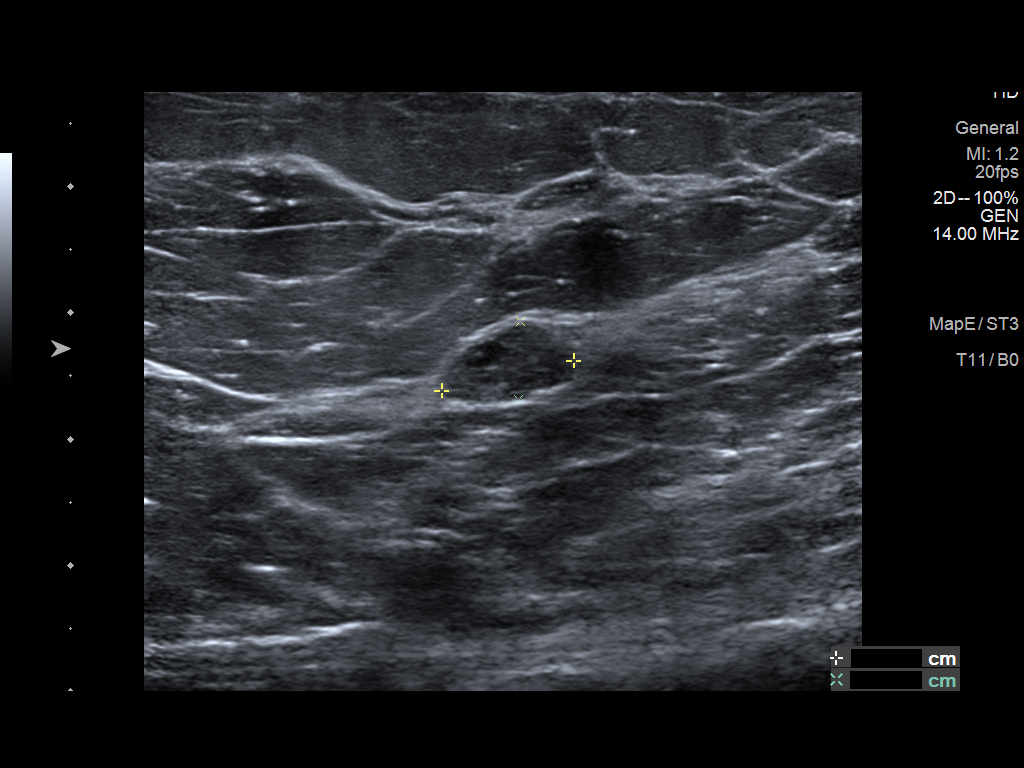
[im 3/5]
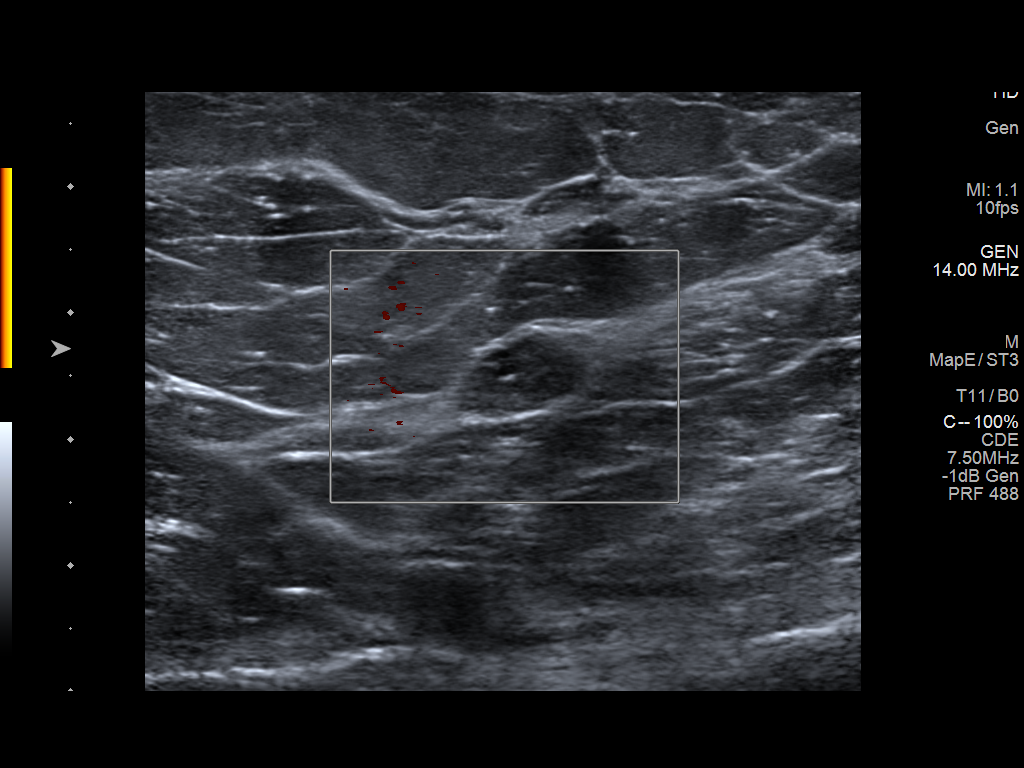
[im 4/5]
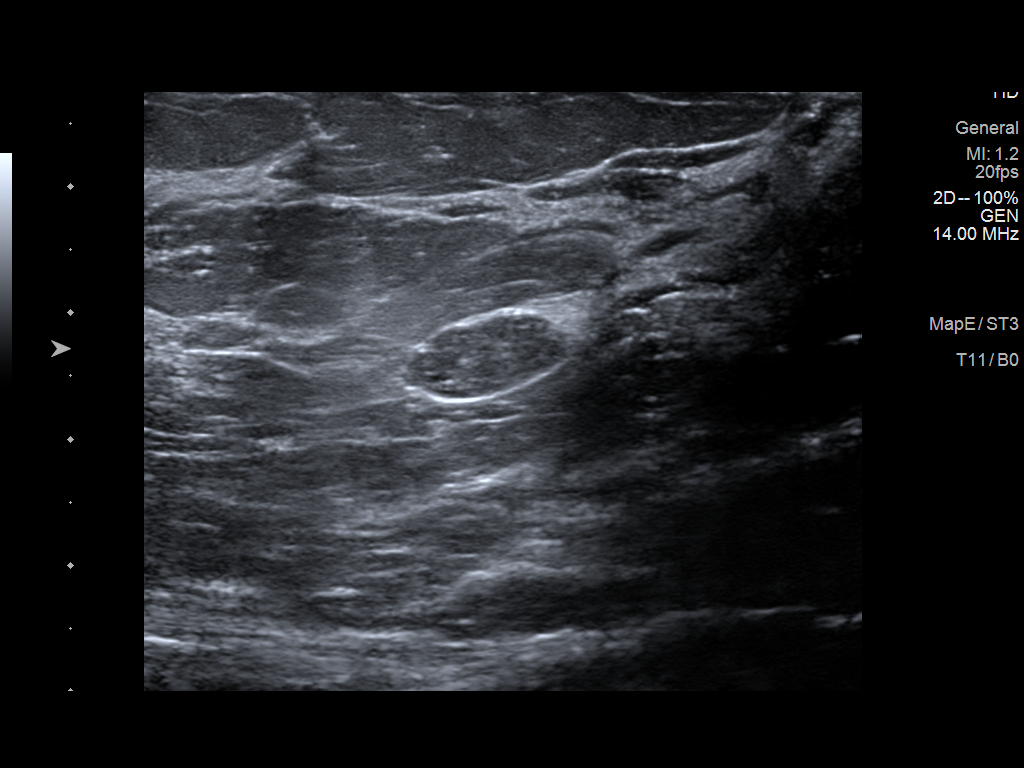
[im 5/5]
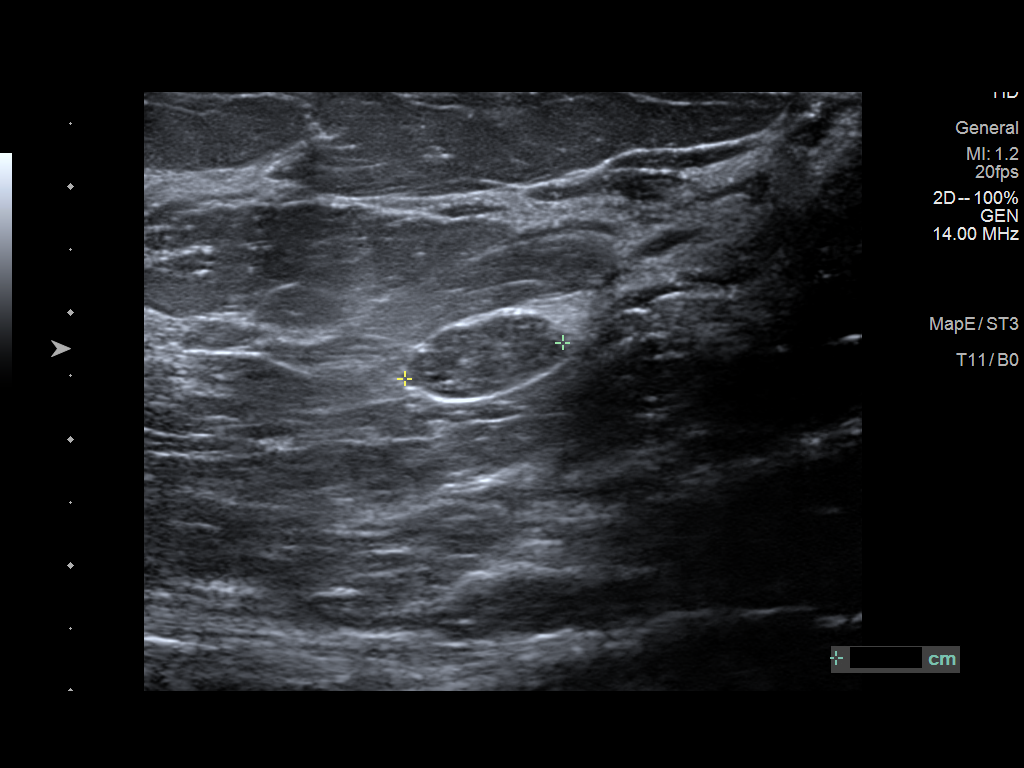

[5 of 5 positions shown; findings below may reference images not displayed]

ACR Breast Density Category c: The breast tissue is heterogeneously
dense, which may obscure small masses.
FINDINGS: Mammogram:

Spot compression tomosynthesis views of the right breast performed
demonstrating persistence of an oval circumscribed mass in the
slightly superior right breast measuring approximately 1.2 cm.

Ultrasound:

Targeted ultrasound is performed in the right breast at 12 o'clock 2
cm from nipple demonstrating an oval circumscribed hypoechoic mass
measuring 1.3 x 0.6 x 1.1 cm. No internal vascularity. This mass
corresponds to the mammographic finding.
IMPRESSION: Probably benign right breast mass at 12 o'clock measuring 1.3 cm,
most likely a fibroadenoma.

RECOMMENDATION:
Right breast ultrasound in 6 months.

I have discussed the findings and recommendations with the patient.
If applicable, a reminder letter will be sent to the patient
regarding the next appointment.

BI-RADS CATEGORY  3: Probably benign.

## 2021-08-28 ENCOUNTER — Encounter (HOSPITAL_BASED_OUTPATIENT_CLINIC_OR_DEPARTMENT_OTHER): Payer: Self-pay | Admitting: Urology

## 2021-08-28 ENCOUNTER — Emergency Department (HOSPITAL_BASED_OUTPATIENT_CLINIC_OR_DEPARTMENT_OTHER)
Admission: EM | Admit: 2021-08-28 | Discharge: 2021-08-28 | Disposition: A | Discharge status: Home / Self Care | Payer: BC Managed Care – PPO | Attending: Emergency Medicine | Admitting: Emergency Medicine

## 2021-08-28 ENCOUNTER — Other Ambulatory Visit: Payer: Self-pay

## 2021-08-28 DIAGNOSIS — Z79899 Other long term (current) drug therapy: Secondary | ICD-10-CM | POA: Diagnosis not present

## 2021-08-28 DIAGNOSIS — E039 Hypothyroidism, unspecified: Secondary | ICD-10-CM | POA: Diagnosis not present

## 2021-08-28 DIAGNOSIS — R300 Dysuria: Secondary | ICD-10-CM | POA: Diagnosis not present

## 2021-08-28 DIAGNOSIS — N3000 Acute cystitis without hematuria: Secondary | ICD-10-CM | POA: Diagnosis not present

## 2021-08-28 DIAGNOSIS — F17210 Nicotine dependence, cigarettes, uncomplicated: Secondary | ICD-10-CM | POA: Insufficient documentation

## 2021-08-28 LAB — URINALYSIS, ROUTINE W REFLEX MICROSCOPIC
Bilirubin Urine: NEGATIVE
Glucose, UA: NEGATIVE mg/dL
Hgb urine dipstick: NEGATIVE
Ketones, ur: NEGATIVE mg/dL
Leukocytes,Ua: NEGATIVE
Nitrite: NEGATIVE
Protein, ur: NEGATIVE mg/dL
Specific Gravity, Urine: <1.005 — ABNORMAL LOW (ref 1.005–1.030)
pH: 5.5 (ref 5.0–8.0)

## 2021-08-28 MED ORDER — CEPHALEXIN 250 MG PO CAPS
500.0000 mg | ORAL_CAPSULE | Freq: Once | ORAL | Status: AC
  Administered 2021-08-28: 500 mg via ORAL
  Filled 2021-08-28: qty 2

## 2021-08-28 MED ORDER — CEPHALEXIN 500 MG PO CAPS: 500.0000 mg | ORAL_CAPSULE | Freq: Two times a day (BID) | ORAL | 0 refills | Status: AC
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
## 2021-08-28 NOTE — ED Triage Notes (Signed)
Burning, intense pain with urination, states urinary frequency.   Took pyridium prior to arrival.

## 2021-08-28 NOTE — Discharge Instructions (Addendum)
Suspect your pain is secondary to a urinary tract infection.  If you develop more severe pain, fever, uncontrollable vomiting/nausea please return for evaluation.  It does not seem that your pain is secondary to kidney stone at this time.

## 2021-08-28 NOTE — ED Notes (Signed)
ED Provider at bedside. 

## 2021-08-30 LAB — URINE CULTURE: Culture: <10000 — AB

## 2021-09-03 ENCOUNTER — Other Ambulatory Visit: Payer: BC Managed Care – PPO

## 2021-09-24 ENCOUNTER — Other Ambulatory Visit: Payer: Self-pay

## 2021-09-24 ENCOUNTER — Ambulatory Visit
Admission: RE | Admit: 2021-09-24 | Discharge: 2021-09-24 | Disposition: A | Discharge status: Home / Self Care | Payer: BC Managed Care – PPO | Source: Ambulatory Visit | Attending: Obstetrics & Gynecology | Admitting: Obstetrics & Gynecology

## 2021-09-24 DIAGNOSIS — R928 Other abnormal and inconclusive findings on diagnostic imaging of breast: Secondary | ICD-10-CM

## 2021-09-24 DIAGNOSIS — R922 Inconclusive mammogram: Secondary | ICD-10-CM | POA: Diagnosis not present

## 2021-09-24 DIAGNOSIS — N6341 Unspecified lump in right breast, subareolar: Secondary | ICD-10-CM | POA: Diagnosis not present

## 2021-10-24 ENCOUNTER — Encounter: Payer: Self-pay | Admitting: Family Medicine

## 2021-10-24 ENCOUNTER — Ambulatory Visit: Payer: BC Managed Care – PPO | Attending: Family Medicine | Admitting: Family Medicine

## 2021-10-24 ENCOUNTER — Other Ambulatory Visit: Payer: Self-pay

## 2021-10-24 VITALS — BP 138/80 | HR 91 | Ht 63.0 in | Wt 175.8 lb

## 2021-10-24 DIAGNOSIS — G4709 Other insomnia: Secondary | ICD-10-CM | POA: Diagnosis not present

## 2021-10-24 DIAGNOSIS — F411 Generalized anxiety disorder: Secondary | ICD-10-CM

## 2021-10-24 DIAGNOSIS — Z1159 Encounter for screening for other viral diseases: Secondary | ICD-10-CM | POA: Diagnosis not present

## 2021-10-24 DIAGNOSIS — Z13228 Encounter for screening for other metabolic disorders: Secondary | ICD-10-CM

## 2021-10-24 DIAGNOSIS — Z8639 Personal history of other endocrine, nutritional and metabolic disease: Secondary | ICD-10-CM | POA: Diagnosis not present

## 2021-10-24 DIAGNOSIS — E782 Mixed hyperlipidemia: Secondary | ICD-10-CM | POA: Insufficient documentation

## 2021-10-24 DIAGNOSIS — L729 Follicular cyst of the skin and subcutaneous tissue, unspecified: Secondary | ICD-10-CM

## 2021-10-24 MED ORDER — HYDROXYZINE HCL 25 MG PO TABS: 25.0000 mg | ORAL_TABLET | Freq: Every evening | ORAL | 1 refills | Status: DC | PRN

## 2021-10-24 MED ORDER — SERTRALINE HCL 25 MG PO TABS: ORAL_TABLET | ORAL | 1 refills | Status: DC

## 2021-10-24 MED ORDER — ROSUVASTATIN CALCIUM 40 MG PO TABS: 40.0000 mg | ORAL_TABLET | Freq: Every day | ORAL | 1 refills | Status: DC

## 2021-10-24 NOTE — Assessment & Plan Note (Signed)
Last TSH was normal in 12/2019 off levothyroxine She complains of levothyroxine causing weight gain in the past Will check thyroid panel today and if still normal will discontinue levothyroxine

## 2021-10-24 NOTE — Assessment & Plan Note (Signed)
-  Stable -Continue Zoloft 

## 2021-10-24 NOTE — Patient Instructions (Signed)

## 2021-10-24 NOTE — Assessment & Plan Note (Signed)
Uncontrolled Nonadherent with statin If lipid panel is elevated today I will make no regimen changes as she is just restarting her statin today Low-cholesterol diet

## 2021-10-24 NOTE — Assessment & Plan Note (Signed)
Uncontrolled Discussed addictive and tolerance effect of Ambien Failed trazodone in the past Trial of hydroxyzine after shared decision making and if uncontrolled we will place on low-dose Ambien. Sleep hygiene will be beneficial.

## 2021-10-24 NOTE — Progress Notes (Signed)
CPE

## 2021-10-24 NOTE — Progress Notes (Signed)
Subjective:  Patient ID: Tasha Hoover, female    DOB: December 17, 1980  Age: 41 y.o. MRN: [id number]  CC: Annual Exam   HPI Tasha Hoover is a 41 y.o. year old female with a history of hyperlipidemia, anxiety and depression, insomnia presents today to establish care.  Interval History: She states she is here for an annual exam but states she is fasting for blood work and is up-to-date on her mammogram and will be having her PAP smear with her GYN.  Currently taking just Zoloft but has not been taking Crestor even though her lipids were elevated from 12/2019. She has also not been taking her Levothyroxine because it made her gain weight and she was placed on it by a PCP prior to Dr Chapman Fitch.  Last TSH from 12/2019 was normal off levothyroxine.  She is requesting a prescription for Ambien for insomnia. She did Trazodone in the past which was ineffective. She has subcutaneous not on her abdomen which she states itches Past Medical History:  Diagnosis Date   Anxiety    CAP (community acquired pneumonia)    Depression    History of kidney stones    Hyperlipidemia    Hypothyroidism    Pneumonia    Pulmonary nodule    per CT 07/2018   Tobacco use     Past Surgical History:  Procedure Laterality Date   CERVICAL CONIZATION W/BX N/A 09/24/2019   Procedure: CONIZATION CERVIX WITH BIOPSY;  Surgeon: Sanjuana Kava, MD;  Location: [facility];  Service: Gynecology;  Laterality: N/A;   CESAREAN SECTION     HAND SURGERY Left     Family History  Problem Relation Age of Onset   CAD Maternal Grandfather    Breast cancer Neg Hx     Allergies  Allergen Reactions   Vancomycin Hives    Outpatient Medications Prior to Visit  Medication Sig Dispense Refill   albuterol (VENTOLIN HFA) 108 (90 Base) MCG/ACT inhaler Inhale 2 puffs into the lungs every 6 (six) hours as needed for wheezing or shortness of breath. 1 each 11   fluticasone (FLONASE) 50 MCG/ACT nasal spray Place 2  sprays into both nostrils daily. 16 g 6   norethindrone-ethinyl estradiol-iron (LOESTRIN FE) 1.5-30 MG-MCG tablet Take 1 tablet by mouth daily.     zolpidem (AMBIEN) 5 MG tablet Take 1 tablet (5 mg total) by mouth at bedtime. 90 tablet 1   sertraline (ZOLOFT) 25 MG tablet TAKE 1 TABLET BY MOUTH EVERYDAY AT BEDTIME 90 tablet 0   ALPRAZolam (XANAX) 1 MG tablet Take 1 mg by mouth 2 (two) times daily as needed for anxiety.  (Patient not taking: Reported on 07/24/2020)     azithromycin (ZITHROMAX) 250 MG tablet Take 2 pills today then one pill daily for 4 days (Patient not taking: Reported on 10/24/2021) 6 tablet 0   levothyroxine (SYNTHROID, LEVOTHROID) 50 MCG tablet Take 50 mcg by mouth daily before breakfast. (Patient not taking: Reported on 07/24/2020)     predniSONE (DELTASONE) 20 MG tablet 2 pill once per day for 5 days; eat before taking medication (Patient not taking: Reported on 10/24/2021) 5 tablet 0   rosuvastatin (CRESTOR) 40 MG tablet TAKE 1 TABLET (40 MG TOTAL) BY MOUTH DAILY. TO LOWER LIPIDS (Patient not taking: Reported on 10/24/2021) 90 tablet 1   No facility-administered medications prior to visit.     ROS Review of Systems  Constitutional:  Negative for activity change, appetite change and fatigue.  HENT:  Negative  for congestion, sinus pressure and sore throat.   Eyes:  Negative for visual disturbance.  Respiratory:  Negative for cough, chest tightness, shortness of breath and wheezing.   Cardiovascular:  Negative for chest pain and palpitations.  Gastrointestinal:  Negative for abdominal distention, abdominal pain and constipation.  Endocrine: Negative for polydipsia.  Genitourinary:  Negative for dysuria and frequency.  Musculoskeletal:  Negative for arthralgias and back pain.  Skin:  Negative for rash.  Neurological:  Negative for tremors, light-headedness and numbness.  Hematological:  Does not bruise/bleed easily.  Psychiatric/Behavioral:  Negative for agitation and  behavioral problems.    Objective:  BP 138/80    Pulse 91    Ht _0  (1.6 m)    Wt 175 lb 12.8 oz (79.7 kg)    SpO2 100%    BMI 31.14 kg/m   BP/Weight 10/24/2021 08/28/2021 00/92/3300  Systolic BP 762 263 335  Diastolic BP 80 79 68  Wt. (Lbs) 175.8 170 176.2  BMI 31.14 30.11 31.21      Physical Exam Constitutional:      Appearance: She is well-developed.  Cardiovascular:     Rate and Rhythm: Normal rate.     Heart sounds: Normal heart sounds. No murmur heard. Pulmonary:     Effort: Pulmonary effort is normal.     Breath sounds: Normal breath sounds. No wheezing or rales.  Chest:     Chest wall: No tenderness.  Abdominal:     General: Bowel sounds are normal. There is no distension.     Palpations: Abdomen is soft. There is no mass.     Tenderness: There is no abdominal tenderness.  Musculoskeletal:        General: Normal range of motion.     Right lower leg: No edema.     Left lower leg: No edema.  Skin:    Comments: Subcutaneous mid abdominal soft, nontender, no superficial skin lesion or rash  Neurological:     Mental Status: She is alert and oriented to person, place, and time.  Psychiatric:        Mood and Affect: Mood normal.    CMP Latest Ref Rng & Units 08/05/2018 08/04/2018 08/03/2018  Glucose 70 - 99 mg/dL 82 81 94  BUN 6 - 20 mg/dL _1 Creatinine 0.44 - 1.00 mg/dL 0.81 0.85 1.02(H)  Sodium 135 - 145 mmol/L 139 140 138  Potassium 3.5 - 5.1 mmol/L 3.9 4.1 4.1  Chloride 98 - 111 mmol/L 109 113(H) 109  CO2 22 - 32 mmol/L 18(L) 20(L) 19(L)  Calcium 8.9 - 10.3 mg/dL 8.5(L) 7.5(L) 8.9  Total Protein 6.5 - 8.1 g/dL - 5.5(L) 7.2  Total Bilirubin 0.3 - 1.2 mg/dL - 0.3 0.9  Alkaline Phos 38 - 126 U/L - 39 52  AST 15 - 41 U/L - 15 26  ALT 0 - 44 U/L - 12 12    Lipid Panel     Component Value Date/Time   CHOL 263 (H) 12/29/2019 1702   TRIG 363 (H) 12/29/2019 1702   HDL 39 (L) 12/29/2019 1702   CHOLHDL 6.7 (H) 12/29/2019 1702   LDLCALC 156 (H)  12/29/2019 1702    CBC    Component Value Date/Time   WBC 9.8 09/21/2019 1519   RBC 4.49 09/21/2019 1519   HGB 14.9 09/21/2019 1519   HCT 43.4 09/21/2019 1519   PLT 332 09/21/2019 1519   MCV 96.7 09/21/2019 1519   MCH 33.2 09/21/2019 1519   MCHC 34.3  09/21/2019 1519   RDW 13.7 09/21/2019 1519   LYMPHSABS 3.1 08/04/2018 0225   MONOABS 0.5 08/04/2018 0225   EOSABS 0.2 08/04/2018 0225   BASOSABS 0.0 08/04/2018 0225    No results found for: HGBA1C  Lab Results  Component Value Date   TSH 2.050 12/29/2019    Assessment & Plan:   Problem List Items Addressed This Visit       Other   Mixed dyslipidemia    Uncontrolled Nonadherent with statin If lipid panel is elevated today I will make no regimen changes as she is just restarting her statin today Low-cholesterol diet      Relevant Medications   rosuvastatin (CRESTOR) 40 MG tablet   Other Relevant Orders   LP+Non-HDL Cholesterol   CMP14+EGFR   GAD (generalized anxiety disorder)    Stable Continue Zoloft      Relevant Medications   hydrOXYzine (ATARAX) 25 MG tablet   sertraline (ZOLOFT) 25 MG tablet   Other insomnia - Primary    Uncontrolled Discussed addictive and tolerance effect of Ambien Failed trazodone in the past Trial of hydroxyzine after shared decision making and if uncontrolled we will place on low-dose Ambien. Sleep hygiene will be beneficial.      Relevant Orders   CBC with Differential/Platelet   History of hypothyroidism    Last TSH was normal in 12/2019 off levothyroxine She complains of levothyroxine causing weight gain in the past Will check thyroid panel today and if still normal will discontinue levothyroxine      Relevant Orders   T4, free   TSH   Other Visit Diagnoses     Screening for metabolic disorder       Relevant Orders   Hemoglobin A1c   Need for hepatitis C screening test       Relevant Orders   HCV Ab w Reflex to Quant PCR   Subcutaneous cyst      Nothing  additional needs to be done at this time.  Advised that she can obtain OTC hydrocortisone if skin is pruritic      Health Care Maintenance: Up-to-date on mammogram; she will undergo Pap smear with her GYN Meds ordered this encounter  Medications   hydrOXYzine (ATARAX) 25 MG tablet    Sig: Take 1 tablet (25 mg total) by mouth at bedtime as needed.    Dispense:  30 tablet    Refill:  1   rosuvastatin (CRESTOR) 40 MG tablet    Sig: Take 1 tablet (40 mg total) by mouth daily. To lower lipids    Dispense:  90 tablet    Refill:  1   sertraline (ZOLOFT) 25 MG tablet    Sig: TAKE 1 TABLET BY MOUTH EVERYDAY AT BEDTIME    Dispense:  90 tablet    Refill:  1    Follow-up: Return in about 6 months (around 04/23/2022) for Chronic medical conditions.       Charlott Rakes, MD, FAAFP. [facility], [place]   10/24/2021, 3:33 PM

## 2021-10-25 LAB — HEMOGLOBIN A1C
Est. average glucose Bld gHb Est-mCnc: 108 mg/dL
Hgb A1c MFr Bld: 5.4 % (ref 4.8–5.6)

## 2021-10-25 LAB — CMP14+EGFR
ALT: 23 IU/L (ref 0–32)
AST: 21 IU/L (ref 0–40)
Albumin/Globulin Ratio: 1.7 (ref 1.2–2.2)
Albumin: 4.7 g/dL (ref 3.8–4.8)
Alkaline Phosphatase: 103 IU/L (ref 44–121)
BUN/Creatinine Ratio: 13 (ref 9–23)
BUN: 12 mg/dL (ref 6–24)
Bilirubin Total: 0.3 mg/dL (ref 0.0–1.2)
CO2: 22 mmol/L (ref 20–29)
Calcium: 9.6 mg/dL (ref 8.7–10.2)
Chloride: 98 mmol/L (ref 96–106)
Creatinine, Ser: 0.94 mg/dL (ref 0.57–1.00)
Globulin, Total: 2.7 g/dL (ref 1.5–4.5)
Glucose: 86 mg/dL (ref 70–99)
Potassium: 4.1 mmol/L (ref 3.5–5.2)
Sodium: 137 mmol/L (ref 134–144)
Total Protein: 7.4 g/dL (ref 6.0–8.5)
eGFR: 78 mL/min/{1.73_m2} (ref >59)

## 2021-10-25 LAB — LP+NON-HDL CHOLESTEROL
Cholesterol, Total: 301 mg/dL — ABNORMAL HIGH (ref 100–199)
HDL: 39 mg/dL — ABNORMAL LOW (ref >39)
LDL Chol Calc (NIH): 177 mg/dL — ABNORMAL HIGH (ref 0–99)
Total Non-HDL-Chol (LDL+VLDL): 262 mg/dL — ABNORMAL HIGH (ref 0–129)
Triglycerides: 430 mg/dL — ABNORMAL HIGH (ref 0–149)
VLDL Cholesterol Cal: 85 mg/dL — ABNORMAL HIGH (ref 5–40)

## 2021-10-25 LAB — CBC WITH DIFFERENTIAL/PLATELET
Basophils Absolute: 0.1 10*3/uL (ref 0.0–0.2)
Basos: 1 %
EOS (ABSOLUTE): 0.4 10*3/uL (ref 0.0–0.4)
Eos: 3 %
Hematocrit: 42.8 % (ref 34.0–46.6)
Hemoglobin: 14.9 g/dL (ref 11.1–15.9)
Immature Grans (Abs): 0.1 10*3/uL (ref 0.0–0.1)
Immature Granulocytes: 1 %
Lymphocytes Absolute: 3.5 10*3/uL — ABNORMAL HIGH (ref 0.7–3.1)
Lymphs: 30 %
MCH: 33.2 pg — ABNORMAL HIGH (ref 26.6–33.0)
MCHC: 34.8 g/dL (ref 31.5–35.7)
MCV: 95 fL (ref 79–97)
Monocytes Absolute: 0.6 10*3/uL (ref 0.1–0.9)
Monocytes: 5 %
Neutrophils Absolute: 7.1 10*3/uL — ABNORMAL HIGH (ref 1.4–7.0)
Neutrophils: 60 %
Platelets: 325 10*3/uL (ref 150–450)
RBC: 4.49 x10E6/uL (ref 3.77–5.28)
RDW: 12.9 % (ref 11.7–15.4)
WBC: 11.8 10*3/uL — ABNORMAL HIGH (ref 3.4–10.8)

## 2021-10-25 LAB — HCV INTERPRETATION

## 2021-10-25 LAB — T4, FREE: Free T4: 1.09 ng/dL (ref 0.82–1.77)

## 2021-10-25 LAB — HCV AB W REFLEX TO QUANT PCR: HCV Ab: 0.6 s/co ratio (ref 0.0–0.9)

## 2021-10-25 LAB — TSH: TSH: 2.21 u[IU]/mL (ref 0.450–4.500)

## 2021-11-07 ENCOUNTER — Encounter: Payer: Self-pay | Admitting: Family Medicine

## 2021-11-07 ENCOUNTER — Other Ambulatory Visit: Payer: Self-pay | Admitting: Family Medicine

## 2021-11-07 MED ORDER — HYDROXYZINE HCL 50 MG PO TABS: 50.0000 mg | ORAL_TABLET | Freq: Every evening | ORAL | 1 refills | Status: DC | PRN

## 2021-11-21 DIAGNOSIS — Z3040 Encounter for surveillance of contraceptives, unspecified: Secondary | ICD-10-CM | POA: Diagnosis not present

## 2021-11-21 DIAGNOSIS — Z124 Encounter for screening for malignant neoplasm of cervix: Secondary | ICD-10-CM | POA: Diagnosis not present

## 2021-11-21 DIAGNOSIS — Z01419 Encounter for gynecological examination (general) (routine) without abnormal findings: Secondary | ICD-10-CM | POA: Diagnosis not present

## 2021-11-29 ENCOUNTER — Other Ambulatory Visit: Payer: Self-pay | Admitting: Family Medicine

## 2021-11-29 MED ORDER — TRAZODONE HCL 100 MG PO TABS: 100.0000 mg | ORAL_TABLET | Freq: Every day | ORAL | 1 refills | Status: DC

## 2021-12-01 ENCOUNTER — Other Ambulatory Visit: Payer: Self-pay | Admitting: Family Medicine

## 2021-12-03 NOTE — Telephone Encounter (Signed)
Requested medication (s) are due for refill today: has one refill remaining  Requested medication (s) are on the active medication list: yes  Last refill:  11/07/21 #30 with 1 RF  Future visit scheduled: 04/23/22  Notes to clinic:  Pharm requets as follows: REQUEST FOR 90 DAYS PRESCRIPTION.    Requested Prescriptions  Pending Prescriptions Disp Refills   hydrOXYzine (ATARAX) 50 MG tablet [Pharmacy Med Name: HYDROXYZINE HCL 50 MG TABLET] 90 tablet 1    Sig: TAKE 1 TABLET BY MOUTH EVERY DAY AT BEDTIME AS NEEDED     Ear, Nose, and Throat:  Antihistamines 2 Passed - 12/01/2021 12:31 PM      Passed - Cr in normal range and within 360 days    Creatinine, Ser  Date Value Ref Range Status  10/24/2021 0.94 0.57 - 1.00 mg/dL Final          Passed - Valid encounter within last 12 months    Recent Outpatient Visits           1 month ago Other insomnia   [facility] [place], Odette Horns, MD   1 year ago COPD with acute exacerbation [facility])   [facility] F[place], [place], [place]   1 year ago Hypothyroidism, unspecified type   [facility] Fulp, [place], [place]   1 year ago Eustachian tube dysfunction, right   [facility] [place], [place], [place]   2 years ago Hypothyroidism, unspecified type   [facility] F[place], [place], MD       Future Appointments             In 4 months Hoy Register, MD [facility]

## 2021-12-21 ENCOUNTER — Other Ambulatory Visit: Payer: Self-pay | Admitting: Family Medicine

## 2021-12-21 NOTE — Telephone Encounter (Signed)
Requested medication (s) are due for refill today: no ? ?Requested medication (s) are on the active medication list: yes ? ?Last refill:  11/29/21 #30 with 1 RF ? ?Future visit scheduled: 04/23/22 ? ?Notes to clinic:  Pharm requesting 90 day supply, current rx does not cover 90 days, please assess. ? ? ?  ? ?Requested Prescriptions  ?Pending Prescriptions Disp Refills  ? traZODone (DESYREL) 100 MG tablet [Pharmacy Med Name: TRAZODONE 100 MG TABLET] 90 tablet 1  ?  Sig: TAKE 1 TABLET BY MOUTH EVERYDAY AT BEDTIME  ?  ? Psychiatry: Antidepressants - Serotonin Modulator Passed - 12/21/2021  8:44 AM  ?  ?  Passed - Valid encounter within last 6 months  ?  Recent Outpatient Visits   ? ?      ? 1 month ago Other insomnia  ? [facility] [place], Odette Horns, MD  ? 1 year ago COPD with acute exacerbation [facility])  ? [facility] Fulp, [place], MD  ? 1 year ago Hypothyroidism, unspecified type  ? [facility] F[place], [place], MD  ? 2 years ago Eustachian tube dysfunction, right  ? [facility] [place], [place], [place]  ? 2 years ago Hypothyroidism, unspecified type  ? [facility] [place], MD  ? ?  ?  ?Future Appointments   ? ?        ? In 4 months Hoy Register, MD [facility]  ? ?  ? ?  ?  ?  ? ? ?

## 2022-01-31 ENCOUNTER — Ambulatory Visit: Payer: BC Managed Care – PPO | Admitting: Physician Assistant

## 2022-01-31 DIAGNOSIS — K219 Gastro-esophageal reflux disease without esophagitis: Secondary | ICD-10-CM | POA: Diagnosis not present

## 2022-01-31 DIAGNOSIS — J019 Acute sinusitis, unspecified: Secondary | ICD-10-CM | POA: Diagnosis not present

## 2022-01-31 DIAGNOSIS — R030 Elevated blood-pressure reading, without diagnosis of hypertension: Secondary | ICD-10-CM | POA: Diagnosis not present

## 2022-04-23 ENCOUNTER — Ambulatory Visit: Payer: BC Managed Care – PPO | Admitting: Family Medicine

## 2022-04-25 ENCOUNTER — Emergency Department (HOSPITAL_COMMUNITY)
Admission: EM | Admit: 2022-04-25 | Discharge: 2022-04-26 | Disposition: A | Discharge status: Home / Self Care | Payer: BC Managed Care – PPO | Attending: Emergency Medicine | Admitting: Emergency Medicine

## 2022-04-25 ENCOUNTER — Other Ambulatory Visit: Payer: Self-pay

## 2022-04-25 ENCOUNTER — Ambulatory Visit
Admission: RE | Admit: 2022-04-25 | Discharge: 2022-04-25 | Disposition: A | Discharge status: Home / Self Care | Payer: BC Managed Care – PPO | Source: Ambulatory Visit

## 2022-04-25 ENCOUNTER — Encounter (HOSPITAL_COMMUNITY): Payer: Self-pay | Admitting: Emergency Medicine

## 2022-04-25 VITALS — BP 151/87 | HR 109 | Temp 98.2°F | Resp 18

## 2022-04-25 DIAGNOSIS — Z87891 Personal history of nicotine dependence: Secondary | ICD-10-CM | POA: Insufficient documentation

## 2022-04-25 DIAGNOSIS — M7989 Other specified soft tissue disorders: Secondary | ICD-10-CM

## 2022-04-25 DIAGNOSIS — M79601 Pain in right arm: Secondary | ICD-10-CM

## 2022-04-25 DIAGNOSIS — D72829 Elevated white blood cell count, unspecified: Secondary | ICD-10-CM | POA: Diagnosis not present

## 2022-04-25 DIAGNOSIS — R600 Localized edema: Secondary | ICD-10-CM | POA: Diagnosis not present

## 2022-04-25 DIAGNOSIS — R2231 Localized swelling, mass and lump, right upper limb: Secondary | ICD-10-CM | POA: Diagnosis not present

## 2022-04-25 LAB — CBC WITH DIFFERENTIAL/PLATELET
Abs Immature Granulocytes: 0.11 10*3/uL — ABNORMAL HIGH (ref 0.00–0.07)
Basophils Absolute: 0.1 10*3/uL (ref 0.0–0.1)
Basophils Relative: 1 %
Eosinophils Absolute: 0.2 10*3/uL (ref 0.0–0.5)
Eosinophils Relative: 1 %
HCT: 42.2 % (ref 36.0–46.0)
Hemoglobin: 14.4 g/dL (ref 12.0–15.0)
Immature Granulocytes: 1 %
Lymphocytes Relative: 23 %
Lymphs Abs: 3.6 10*3/uL (ref 0.7–4.0)
MCH: 32.7 pg (ref 26.0–34.0)
MCHC: 34.1 g/dL (ref 30.0–36.0)
MCV: 95.9 fL (ref 80.0–100.0)
Monocytes Absolute: 0.9 10*3/uL (ref 0.1–1.0)
Monocytes Relative: 6 %
Neutro Abs: 10.9 10*3/uL — ABNORMAL HIGH (ref 1.7–7.7)
Neutrophils Relative %: 68 %
Platelets: 282 10*3/uL (ref 150–400)
RBC: 4.4 MIL/uL (ref 3.87–5.11)
RDW: 12.8 % (ref 11.5–15.5)
WBC: 15.8 10*3/uL — ABNORMAL HIGH (ref 4.0–10.5)
nRBC: 0 % (ref 0.0–0.2)

## 2022-04-25 LAB — BASIC METABOLIC PANEL
Anion gap: 10 (ref 5–15)
BUN: 8 mg/dL (ref 6–20)
CO2: 20 mmol/L — ABNORMAL LOW (ref 22–32)
Calcium: 9.2 mg/dL (ref 8.9–10.3)
Chloride: 107 mmol/L (ref 98–111)
Creatinine, Ser: 0.89 mg/dL (ref 0.44–1.00)
GFR, Estimated: >60 mL/min (ref >60)
Glucose, Bld: 78 mg/dL (ref 70–99)
Potassium: 3.7 mmol/L (ref 3.5–5.1)
Sodium: 137 mmol/L (ref 135–145)
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
## 2022-04-25 NOTE — Discharge Instructions (Addendum)
Go to the emergency department

## 2022-04-25 NOTE — ED Notes (Signed)
Patient is being discharged from the Urgent Care and sent to the Emergency Department via private vehicle . Per Leanna Battles NP, patient is in need of higher level of care due to need for imaging beyond scope of UC. Patient is aware and verbalizes understanding of plan of care.  Vitals:   04/25/22 1701  BP: (!) 151/87  Pulse: (!) 109  Resp: 18  Temp: 98.2 F (36.8 C)  SpO2: 97%

## 2022-04-25 NOTE — ED Triage Notes (Signed)
Pt reported to ED for evaluation of swelling to rt arm x1 week with pain x2 days. Sent here from [facility] for further evaluation of blood clot.
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
## 2022-04-25 NOTE — ED Triage Notes (Signed)
Pt presents with R arm pain x 2 days, possibly swollen up to 2 weeks.  Area near bicep swollen with noticeable pea-sized knot.  No known injury. Pain radiates down into fingers. No numbness/tingling. No loss of strength.

## 2022-04-26 ENCOUNTER — Emergency Department (HOSPITAL_BASED_OUTPATIENT_CLINIC_OR_DEPARTMENT_OTHER): Payer: BC Managed Care – PPO

## 2022-04-26 DIAGNOSIS — M7989 Other specified soft tissue disorders: Secondary | ICD-10-CM | POA: Diagnosis not present

## 2022-04-26 MED ORDER — CEPHALEXIN 500 MG PO CAPS: 500.0000 mg | ORAL_CAPSULE | Freq: Four times a day (QID) | ORAL | 0 refills | Status: DC

## 2022-04-26 NOTE — ED Notes (Signed)
Discharge instructions reviewed with patient. Patient denies any questions or concerns. Pt ambulatory out of ED.  

## 2022-04-26 NOTE — ED Provider Notes (Signed)
[facility] Provider Note   CSN: [id number] Arrival date & time: 04/25/22  1747     History  Chief Complaint  Patient presents with   Arm Swelling    Tasha Hoover is a 41 y.o. female. With past medical history for HLD, who presents to the emergency department with arm swelling.  States that she noticed swelling beginning about 1 week ago.  She states that then about 3 days ago she began having pain to the outside of her right upper arm.  She describes intermittently having sharp shooting pain that goes down her right arm.  She is otherwise not noticed any focal redness, warmth or drainage from the area.  She denies any trauma to the arm.  She denies knowingly being bitten by any bugs.  She denies any fevers, shortness of breath.  She denies history of DVT.  She was sent here from urgent care for DVT work-up given that she has recently flown last week and has history of smoking and contraceptive use.  HPI     Home Medications Prior to Admission medications   Medication Sig Start Date End Date Taking? Authorizing Provider  albuterol (VENTOLIN HFA) 108 (90 Base) MCG/ACT inhaler Inhale 2 puffs into the lungs every 6 (six) hours as needed for wheezing or shortness of breath. 07/24/20   Fulp, Cammie, MD  ALPRAZolam (XANAX) 1 MG tablet Take 1 mg by mouth 2 (two) times daily as needed for anxiety.    [provider]  azithromycin (ZITHROMAX) 250 MG tablet Take 2 pills today then one pill daily for 4 days Patient not taking: Reported on 10/24/2021 07/24/20   Fulp, Cammie, MD  fluticasone (FLONASE) 50 MCG/ACT nasal spray Place 2 sprays into both nostrils daily. 12/15/19   Argentina Donovan, PA-C  hydrOXYzine (ATARAX) 50 MG tablet TAKE 1 TABLET BY MOUTH EVERY DAY AT BEDTIME AS NEEDED 12/03/21   Charlott Rakes, MD  levothyroxine (SYNTHROID, LEVOTHROID) 50 MCG tablet Take 50 mcg by mouth daily before breakfast. Patient not taking: Reported on  07/24/2020    [provider]  norethindrone-ethinyl estradiol-iron (LOESTRIN FE) 1.5-30 MG-MCG tablet Take 1 tablet by mouth daily.    [provider]  predniSONE (DELTASONE) 20 MG tablet 2 pill once per day for 5 days; eat before taking medication Patient not taking: Reported on 10/24/2021 07/24/20   Fulp, Cammie, MD  rosuvastatin (CRESTOR) 40 MG tablet Take 1 tablet (40 mg total) by mouth daily. To lower lipids 10/24/21   Charlott Rakes, MD  sertraline (ZOLOFT) 25 MG tablet TAKE 1 TABLET BY MOUTH EVERYDAY AT BEDTIME 10/24/21   Charlott Rakes, MD  traZODone (DESYREL) 100 MG tablet TAKE 1 TABLET BY MOUTH EVERYDAY AT BEDTIME 12/21/21   Charlott Rakes, MD  traZODone (DESYREL) 150 MG tablet Take by mouth at bedtime.    [provider]  zolpidem (AMBIEN) 5 MG tablet Take 1 tablet (5 mg total) by mouth at bedtime. 07/24/20   Fulp, Cammie, MD      Allergies    Vancomycin    Review of Systems   Review of Systems  Skin:        Right arm swelling  All other systems reviewed and are negative.   Physical Exam Updated Vital Signs BP 130/84   Pulse 90   Temp 98.9 F (37.2 C)   Resp 18   SpO2 99%  Physical Exam Vitals and nursing note reviewed.  Constitutional:      General:  She is not in acute distress.    Appearance: Normal appearance.  HENT:     Head: Normocephalic and atraumatic.  Eyes:     General: No scleral icterus.    Extraocular Movements: Extraocular movements intact.  Cardiovascular:     Rate and Rhythm: Normal rate and regular rhythm.     Pulses: Normal pulses.     Heart sounds: No murmur heard. Pulmonary:     Effort: Pulmonary effort is normal. No respiratory distress.     Breath sounds: Normal breath sounds.  Musculoskeletal:        General: Swelling and tenderness present. Normal range of motion.  Skin:    General: Skin is warm and dry.     Capillary Refill: Capillary refill takes less than 2 seconds.     Findings: No abscess, erythema or  rash.     Comments: There is around a 2 to 3 cm area of swelling to the outside of the right arm just distal to the deltoid.  No erythema, warmth, induration or fluctuance.  Pulses intact, cap refill less than 2 seconds, neurovascular intact, compartments soft  Neurological:     General: No focal deficit present.     Mental Status: She is alert and oriented to person, place, and time. Mental status is at baseline.  Psychiatric:        Mood and Affect: Mood normal.        Behavior: Behavior normal.        Thought Content: Thought content normal.        Judgment: Judgment normal.    ED Results / Procedures / Treatments   Labs (all labs ordered are listed, but only abnormal results are displayed) Labs Reviewed  CBC WITH DIFFERENTIAL/PLATELET - Abnormal; Notable for the following components:      Result Value   WBC 15.8 (*)    Neutro Abs 10.9 (*)    Abs Immature Granulocytes 0.11 (*)    All other components within normal limits  BASIC METABOLIC PANEL - Abnormal; Notable for the following components:   CO2 20 (*)    All other components within normal limits   EKG None  Radiology UE VENOUS DUPLEX (7am - 7pm)  Result Date: 04/26/2022 UPPER VENOUS STUDY  Patient Name:  Tasha Hoover  Date of Exam:   04/26/2022 Medical Rec #: [id number]           Accession #:    [id number] Date of Birth: 09-24-1981           Patient Gender: F Patient Age:   21 years Exam Location:  [facility] Procedure:      VAS [place] UPPER EXTREMITY VENOUS DUPLEX Referring Phys: CHRISTIAN PROSPERI --------------------------------------------------------------------------------  Indications: Localized swelling of lateral upper arm. Comparison Study: No previous exams Performing Technologist: Jody Hill RVT, RDMS  Examination Guidelines: A complete evaluation includes B-mode imaging, spectral Doppler, color Doppler, and power Doppler as needed of all accessible portions of each vessel. Bilateral testing is considered an  integral part of a complete examination. Limited examinations for reoccurring indications may be performed as noted.  Right Findings: +----------+------------+---------+-----------+----------+--------------+ RIGHT     CompressiblePhasicitySpontaneousProperties   Summary     +----------+------------+---------+-----------+----------+--------------+ IJV           Full       Yes       Yes                             +----------+------------+---------+-----------+----------+--------------+  Subclavian    Full       Yes       Yes                             +----------+------------+---------+-----------+----------+--------------+ Axillary      Full       Yes       Yes                             +----------+------------+---------+-----------+----------+--------------+ Brachial      Full       Yes       Yes                             +----------+------------+---------+-----------+----------+--------------+ Radial        Full                                                 +----------+------------+---------+-----------+----------+--------------+ Ulnar         Full                                                 +----------+------------+---------+-----------+----------+--------------+ Cephalic                                            Not visualized +----------+------------+---------+-----------+----------+--------------+ Basilic       Full       Yes       Yes                             +----------+------------+---------+-----------+----------+--------------+  Left Findings: +----------+------------+---------+-----------+----------+-------+ LEFT      CompressiblePhasicitySpontaneousPropertiesSummary +----------+------------+---------+-----------+----------+-------+ Subclavian    Full       Yes       Yes                      +----------+------------+---------+-----------+----------+-------+  Summary:  Right: No evidence of deep vein thrombosis in  the upper extremity. No evidence of superficial vein thrombosis in the upper extremity. However, unable to visualize the cephalic vein.  Left: No evidence of thrombosis in the subclavian.  *See table(s) above for measurements and observations.    Preliminary     Procedures Procedures   Medications Ordered in ED Medications - No data to display  ED Course/ Medical Decision Making/ A&P                           Medical Decision Making Risk Prescription drug management.  This patient presents to the ED for concern of arm swelling, this involves an extensive number of treatment options, and is a complaint that carries with it a high risk of complications and morbidity.  The differential diagnosis includes DVT, cellulitis, abscess, bug bite, lipoma, etc.   Co morbidities that complicate the patient evaluation None   Additional history obtained:  Additional history obtained from: husband at bedside  External records  from outside source obtained and reviewed including: urgent care provider note   Lab Results: I personally ordered, reviewed, and interpreted labs. Pertinent results include: BMP within normal limits  CBC with leukocytosis to 15.8, unclear   Imaging Studies ordered:  I ordered imaging studies which included ultrasound.  I independently reviewed & interpreted imaging & am in agreement with radiology impression. Imaging shows: US DVT study negative   Medications  -I reviewed the patient's home medications and did not make adjustments. -I did  prescribe new home medications.  Tests Considered: Could consider ultrasound soft tissue, ESR, CRP, but clinically well without evidence of sepsis. No abscess on physical exam  Critical Interventions: N/a  Consultations: N/A  SDH None identified  ED Course:  41 year old female who presents to the emergency department with swelling of her outer, upper right arm.  Physical exam with a small, pea-sized knot under the skin  with some surrounding swelling.  There is no erythema, warmth, fluctuance or induration concerning for an abscess.  There is no streaking or lymphangitic spreading. Cap refill was less than 2 seconds, neurovascularly intact and compartments soft.  She had a ultrasound DVT study which was negative This is atraumatic swelling, will not pursue x-ray at this time She did have basic labs performed in triage that showed that she did have a white count of 15.8.  Unclear etiology.  Although she does not have a clear abscess, she may have the beginning of an abscess or some underlying cellulitis.  We will put her on Keflex for the next 5 days to cover for cellulitis.  She is instructed to return if there is any evidence of an abscess which I educated her on.  She verbalized understanding.  Additionally to return if she begins feeling short of breath or has progressive swelling with fever.  She did have questions about if she did fly this weekend.  Given that she does not have any clot, feel that this is appropriate.  After consideration of the diagnostic results and the patients response to treatment, I feel that the patent would benefit from discharge. The patient has been appropriately medically screened and/or stabilized in the ED. I have low suspicion for any other emergent medical condition which would require further screening, evaluation or treatment in the ED or require inpatient management. The patient is overall well appearing and non-toxic in appearance. They are hemodynamically stable at time of discharge.   Final Clinical Impression(s) / ED Diagnoses Final diagnoses:  Swelling of upper arm    Rx / DC Orders ED Discharge Orders          Ordered    cephALEXin (KEFLEX) 500 MG capsule  4 times daily        04/26/22 1028              Mickie Hillier, PA-C 04/26/22 1039    Sherwood Gambler, MD 04/26/22 1458

## 2022-04-26 NOTE — Discharge Instructions (Signed)
You were seen in the emergency department today for swelling of your right upper arm.  You do not have a DVT.  You may have some inflammation that may be skin infection, but it does not appear that you have an abscess or obvious cellulitis at this time.  We are placing you on some antibiotics to see if the swelling goes down.  You will take this 4 times a day over the next 5 days.  You may also use ice multiple times a day on the area to help reduce some swelling.  Please return to the emergency department if you begin to have focal redness, warmth and further swelling concerning for an abscess.

## 2022-04-26 NOTE — Progress Notes (Signed)
RUE venous duplex has been completed.  Attempted to call report to triage provider 3x with no answer.    Results can be found under chart review under CV PROC. 04/26/2022 8:47 AM Flem Enderle RVT, RDMS

## 2022-06-06 ENCOUNTER — Ambulatory Visit (INDEPENDENT_AMBULATORY_CARE_PROVIDER_SITE_OTHER): Payer: BC Managed Care – PPO | Admitting: Family

## 2022-06-06 ENCOUNTER — Encounter: Payer: Self-pay | Admitting: Family

## 2022-06-06 VITALS — BP 114/80 | HR 83 | Temp 98.2°F | Resp 16 | Ht 63.0 in | Wt 175.5 lb

## 2022-06-06 DIAGNOSIS — J432 Centrilobular emphysema: Secondary | ICD-10-CM | POA: Insufficient documentation

## 2022-06-06 DIAGNOSIS — G479 Sleep disorder, unspecified: Secondary | ICD-10-CM | POA: Diagnosis not present

## 2022-06-06 DIAGNOSIS — E782 Mixed hyperlipidemia: Secondary | ICD-10-CM | POA: Diagnosis not present

## 2022-06-06 DIAGNOSIS — Z720 Tobacco use: Secondary | ICD-10-CM

## 2022-06-06 DIAGNOSIS — R946 Abnormal results of thyroid function studies: Secondary | ICD-10-CM | POA: Insufficient documentation

## 2022-06-06 DIAGNOSIS — R911 Solitary pulmonary nodule: Secondary | ICD-10-CM

## 2022-06-06 MED ORDER — TRAZODONE HCL 150 MG PO TABS: 150.0000 mg | ORAL_TABLET | Freq: Every day | ORAL | 1 refills | Status: DC

## 2022-06-06 MED ORDER — ANORO ELLIPTA 62.5-25 MCG/ACT IN AEPB: 1.0000 | INHALATION_SPRAY | Freq: Every day | RESPIRATORY_TRACT | 5 refills | Status: AC

## 2022-06-06 MED ORDER — ROSUVASTATIN CALCIUM 40 MG PO TABS: 40.0000 mg | ORAL_TABLET | Freq: Every day | ORAL | 1 refills | Status: DC

## 2022-06-06 NOTE — Patient Instructions (Addendum)
  I have created an order for lab work today during our visit.  Please schedule an appointment on your way out to return to the lab at your convenience. Please return fasting at your lab appointment (meaning you can only drink black coffee and or water prior to your appointment). I will reach out to you in regards to the labs when I receive the results.   A referral was placed today for lung cancer referral.  Please let us know if you have not heard back within 2 weeks about the referral.   Welcome to our clinic, I am happy to have you as my new patient. I am excited to continue on this healthcare journey with you.  Stop by the lab prior to leaving today. I will notify you of your results once received.   Please keep in mind Any my chart messages you send have up to a three business day turnaround for a response.  Phone calls may take up to a one full business day turnaround for a  response.   If you need a medication refill I recommend you request it through the pharmacy as this is easiest for [place] rather than sending a message and or phone call.   Due to recent changes in healthcare laws, you may see results of your imaging and/or laboratory studies on MyChart before I have had a chance to review them.  I understand that in some cases there may be results that are confusing or concerning to you. Please understand that not all results are received at the same time and often I may need to interpret multiple results in order to provide you with the best plan of care or course of treatment. Therefore, I ask that you please give me 2 business days to thoroughly review all your results before contacting my office for clarification. Should we see a critical lab result, you will be contacted sooner.   It was a pleasure seeing you today! Please do not hesitate to reach out with any questions and or concerns.  Regards,   Mort Sawyers FNP-C

## 2022-06-06 NOTE — Progress Notes (Signed)
New Patient Office Visit  Subjective:  Patient ID: Tasha Hoover, female    DOB: [birth year]  Age: 41 y.o. MRN: [id number]  CC:  Chief Complaint  Patient presents with   Establish Care    HPI Tasha Hoover is here to establish care as a new patient.  Prior provider was: Dr. Margarita Rana at [facility] outpatient  Pt is without acute concerns.   Abnormal thyroid: took levothyroxine but feels as though it made her gain weight so she topped and states thyroid was 'normal' since.   -Ab pap HSIL back in 2020, had biopsy with high grade dysplasia followed by colposcopy.  Last pap was 6-7 months ago. Gyn was [facility]. She is due for yearly in about three months will call to make appt.   -Mammo: 09/24/21 stable probably benign right bresat mass per impression. Informed to repeat one year.      10/24/2021    3:02 PM 07/24/2020    1:48 PM 09/29/2019    4:00 PM  GAD 7 : Generalized Anxiety Score  Nervous, Anxious, on Edge 1 0 1  Control/stop worrying 1 0 0  Worry too much - different things 1 0 1  Trouble relaxing 1 0 1  Restless 3 0 0  Easily annoyed or irritable 2 0 0  Afraid - awful might happen 0 0 0  Total GAD 7 Score 9 0 3       10/24/2021    3:02 PM 07/24/2020    1:48 PM 09/29/2019    4:00 PM  PHQ9 SCORE ONLY  PHQ-9 Total Score 12 0 7    chronic concerns:  Sleep disorder: trazodone 100 mg however only sleeping six hours did take 150 in the past, she would like to increase. She sleeps 3 hours most when not taking sleep medication. Has been on trazodone for years.   Hyperlipidemia: taking rosuvastatin 40 mg tolerating well.   Emphysema: 25 pack year smoker, emphysema noted on CT scan chest 2021. She does state with doe when walking up a hill, can walk on a flat service for a bit before getting out of breath but is worse than her peers. She does have sob.   mMRC dyspnea scale, asked in office, on a scale 0-4  0 I only get breathless with strenuous exercise  1 I get  short of breath when hurrying on level ground or walking up a slight hill  2 on level ground, I walk slower than people of the same age because of breathlessness      or have to stop for breath when walking my own pace  3 I stop for breath after walking about 100 yards or after a few minutes on level ground  4 I am too breathless to lave the house or I am breathless when dressing   Have you had 2 or more moderate exacerbations or at least 1 hospitalization for COPD exacerbation in the past year? No   Does the patient have high peripheral eosinophil levels (>300 ): unknown, will order today.   Past Medical History:  Diagnosis Date   Anxiety    CAP (community acquired pneumonia)    Depression    History of kidney stones    Hyperlipidemia    Hypothyroidism    Pneumonia    Pulmonary nodule    per CT 07/2018   Splenic trauma 04/09/2015   Tobacco use     Past Surgical History:  Procedure Laterality Date   CERVICAL CONIZATION  W/BX N/A 09/24/2019   Procedure: CONIZATION CERVIX WITH BIOPSY;  Surgeon: Sanjuana Kava, MD;  Location: [facility];  Service: Gynecology;  Laterality: N/A;   CESAREAN SECTION     HAND SURGERY Left    TUBAL LIGATION  11/12/2005    Family History  Problem Relation Age of Onset   CAD Maternal Grandfather    Breast cancer Neg Hx     Social History   Socioeconomic History   Marital status: Significant Other    Spouse name: Not on file   Number of children: 2   Years of education: Not on file   Highest education level: Not on file  Occupational History    Employer: [employer]  Tobacco Use   Smoking status: Every Day    Packs/day: 1.00    Years: 25.00    Total pack years: 25.00    Types: Cigarettes   Smokeless tobacco: Never  Vaping Use   Vaping Use: Never used  Substance and Sexual Activity   Alcohol use: No   Drug use: Never   Sexual activity: Yes    Partners: Male    Birth control/protection: Pill, Surgical   Other Topics Concern   Not on file  Social History Narrative   Not on file   Social Determinants of Health   Financial Resource Strain: Not on file  Food Insecurity: Not on file  Transportation Needs: Not on file  Physical Activity: Not on file  Stress: Not on file  Social Connections: Not on file  Intimate Partner Violence: Not on file    Outpatient Medications Prior to Visit  Medication Sig Dispense Refill   albuterol (VENTOLIN HFA) 108 (90 Base) MCG/ACT inhaler Inhale 2 puffs into the lungs every 6 (six) hours as needed for wheezing or shortness of breath. 1 each 11   fluticasone (FLONASE) 50 MCG/ACT nasal spray Place 2 sprays into both nostrils daily. 16 g 6   norethindrone-ethinyl estradiol-iron (LOESTRIN FE) 1.5-30 MG-MCG tablet Take 1 tablet by mouth daily.     ALPRAZolam (XANAX) 1 MG tablet Take 1 mg by mouth 2 (two) times daily as needed for anxiety.     azithromycin (ZITHROMAX) 250 MG tablet Take 2 pills today then one pill daily for 4 days (Patient not taking: Reported on 10/24/2021) 6 tablet 0   cephALEXin (KEFLEX) 500 MG capsule Take 1 capsule (500 mg total) by mouth 4 (four) times daily. 20 capsule 0   hydrOXYzine (ATARAX) 50 MG tablet TAKE 1 TABLET BY MOUTH EVERY DAY AT BEDTIME AS NEEDED 90 tablet 0   levothyroxine (SYNTHROID, LEVOTHROID) 50 MCG tablet Take 50 mcg by mouth daily before breakfast. (Patient not taking: Reported on 07/24/2020)     predniSONE (DELTASONE) 20 MG tablet 2 pill once per day for 5 days; eat before taking medication (Patient not taking: Reported on 10/24/2021) 5 tablet 0   rosuvastatin (CRESTOR) 40 MG tablet Take 1 tablet (40 mg total) by mouth daily. To lower lipids 90 tablet 1   sertraline (ZOLOFT) 25 MG tablet TAKE 1 TABLET BY MOUTH EVERYDAY AT BEDTIME 90 tablet 1   traZODone (DESYREL) 100 MG tablet TAKE 1 TABLET BY MOUTH EVERYDAY AT BEDTIME 90 tablet 1   traZODone (DESYREL) 150 MG tablet Take by mouth at bedtime.     zolpidem (AMBIEN) 5 MG  tablet Take 1 tablet (5 mg total) by mouth at bedtime. 90 tablet 1   No facility-administered medications prior to visit.    Allergies  Allergen  Reactions   Vancomycin Hives    ROS Review of Systems  Review of Systems  Respiratory:  Negative for shortness of breath.   Cardiovascular:  Negative for chest pain and palpitations.  Gastrointestinal:  Negative for constipation and diarrhea.  Genitourinary:  Negative for dysuria, frequency and urgency.  Musculoskeletal:  Negative for myalgias.  Psychiatric/Behavioral:  Negative for depression and suicidal ideas.   All other systems reviewed and are negative.    Objective:    Physical Exam  Gen: NAD, resting comfortably CV: RRR with no murmurs appreciated Pulm: NWOB, CTAB with no crackles, wheezes, or rhonchi Skin: warm, dry Psych: Normal affect and thought content  BP 114/80   Pulse 83   Temp 98.2 F (36.8 C)   Resp 16   Ht '5\' 3"'  (1.6 m)   Wt 175 lb 8 oz (79.6 kg)   SpO2 98%   BMI 31.09 kg/m  Wt Readings from Last 3 Encounters:  06/06/22 175 lb 8 oz (79.6 kg)  10/24/21 175 lb 12.8 oz (79.7 kg)  08/28/21 170 lb (77.1 kg)     Health Maintenance Due  Topic Date Due   COVID-19 Vaccine (1) Never done   PAP SMEAR-Modifier  Never done   INFLUENZA VACCINE  Never done    There are no preventive care reminders to display for this patient.  Lab Results  Component Value Date   TSH 2.210 10/24/2021   Lab Results  Component Value Date   WBC 15.8 (H) 04/25/2022   HGB 14.4 04/25/2022   HCT 42.2 04/25/2022   MCV 95.9 04/25/2022   PLT 282 04/25/2022   Lab Results  Component Value Date   NA 137 04/25/2022   K 3.7 04/25/2022   CO2 20 (L) 04/25/2022   GLUCOSE 78 04/25/2022   BUN 8 04/25/2022   CREATININE 0.89 04/25/2022   BILITOT 0.3 10/24/2021   ALKPHOS 103 10/24/2021   AST 21 10/24/2021   ALT 23 10/24/2021   PROT 7.4 10/24/2021   ALBUMIN 4.7 10/24/2021   CALCIUM 9.2 04/25/2022   ANIONGAP 10 04/25/2022    EGFR 78 10/24/2021   Lab Results  Component Value Date   CHOL 301 (H) 10/24/2021   Lab Results  Component Value Date   HDL 39 (L) 10/24/2021   Lab Results  Component Value Date   LDLCALC 177 (H) 10/24/2021   Lab Results  Component Value Date   TRIG 430 (H) 10/24/2021   Lab Results  Component Value Date   CHOLHDL 6.7 (H) 12/29/2019   Lab Results  Component Value Date   HGBA1C 5.4 10/24/2021      Assessment & Plan:   Problem List Items Addressed This Visit       Respiratory   Pulmonary nodule    Referral placed for lung cancer screening due to h/o nodule and smoking history      Centrilobular emphysema ([place])    Recommend pt start on daily inhaler due to symptoms per gold standards.  Start anoro elipita inahler 62.5 -25 cg/act F/u one month      Relevant Medications   umeclidinium-vilanterol (ANORO ELLIPTA) 62.5-25 MCG/ACT AEPB   Other Relevant Orders   Ambulatory Referral Lung Cancer Screening  Pulmonary     Other   HLD (hyperlipidemia)    Ordered lipid panel, pending results. Work on low cholesterol diet and exercise as tolerated Continue crestor 40 mg      Relevant Medications   rosuvastatin (CRESTOR) 40 MG tablet   Tobacco use  Smoking cessation instruction/counseling given:  counseled patient on the dangers of tobacco use, advised patient to stop smoking, and reviewed strategies to maximize success       Relevant Orders   Ambulatory Referral Lung Cancer Screening [facility]   Abnormal thyroid function test    Order thyroid panel with tsh Pending results      Relevant Orders   Thyroid Panel With TSH   Sleep disorder - Primary    rx trazodone 150 mg nightly Increase to 150 mg Discussed to work on sleep hygiene handout sent to [employer]      Relevant Medications   traZODone (DESYREL) 150 MG tablet   Other Relevant Orders   Comprehensive metabolic panel   CBC   RESOLVED: Mixed dyslipidemia   Relevant Medications    rosuvastatin (CRESTOR) 40 MG tablet   Other Relevant Orders   Lipid panel    Meds ordered this encounter  Medications   DISCONTD: traZODone (DESYREL) 150 MG tablet    Sig: Take 1 tablet (150 mg total) by mouth at bedtime.    Dispense:  90 tablet    Refill:  1    Order Specific Question:   Supervising Provider    Answer:   BEDSOLE, AMY E [[id number]]   traZODone (DESYREL) 150 MG tablet    Sig: Take 1 tablet (150 mg total) by mouth at bedtime.    Dispense:  90 tablet    Refill:  1    Order Specific Question:   Supervising Provider    Answer:   BEDSOLE, AMY E [[id number]]   rosuvastatin (CRESTOR) 40 MG tablet    Sig: Take 1 tablet (40 mg total) by mouth daily. To lower lipids    Dispense:  90 tablet    Refill:  1    Order Specific Question:   Supervising Provider    Answer:   BEDSOLE, AMY E [[id number]]   umeclidinium-vilanterol (ANORO ELLIPTA) 62.5-25 MCG/ACT AEPB    Sig: Inhale 1 puff into the lungs daily.    Dispense:  1 each    Refill:  5    Order Specific Question:   Supervising Provider    Answer:   Diona Browner, AMY E [[id number]]    Follow-up: Return in about 6 months (around 12/05/2022) for regular follow up appointment .    Eugenia Pancoast, FNP

## 2022-06-08 NOTE — Assessment & Plan Note (Signed)
Recommend pt start on daily inhaler due to symptoms per gold standards.  Start anoro elipita inahler 62.5 -25 cg/act F/u one month

## 2022-06-08 NOTE — Assessment & Plan Note (Signed)
rx trazodone 150 mg nightly Increase to 150 mg Discussed to work on sleep hygiene handout sent to [employer]

## 2022-06-08 NOTE — Assessment & Plan Note (Signed)
Smoking cessation instruction/counseling given:  counseled patient on the dangers of tobacco use, advised patient to stop smoking, and reviewed strategies to maximize success 

## 2022-06-08 NOTE — Assessment & Plan Note (Signed)
Referral placed for lung cancer screening due to h/o nodule and smoking history

## 2022-06-08 NOTE — Assessment & Plan Note (Signed)
Order thyroid panel with tsh Pending results

## 2022-06-08 NOTE — Assessment & Plan Note (Signed)
Ordered lipid panel, pending results. Work on low cholesterol diet and exercise as tolerated Continue crestor 40 mg

## 2022-06-12 ENCOUNTER — Other Ambulatory Visit (INDEPENDENT_AMBULATORY_CARE_PROVIDER_SITE_OTHER): Payer: BC Managed Care – PPO

## 2022-06-12 DIAGNOSIS — G479 Sleep disorder, unspecified: Secondary | ICD-10-CM

## 2022-06-12 DIAGNOSIS — E782 Mixed hyperlipidemia: Secondary | ICD-10-CM | POA: Diagnosis not present

## 2022-06-12 DIAGNOSIS — R7989 Other specified abnormal findings of blood chemistry: Secondary | ICD-10-CM | POA: Diagnosis not present

## 2022-06-12 DIAGNOSIS — R946 Abnormal results of thyroid function studies: Secondary | ICD-10-CM

## 2022-06-12 LAB — COMPREHENSIVE METABOLIC PANEL
ALT: 13 U/L (ref 0–35)
AST: 14 U/L (ref 0–37)
Albumin: 4.2 g/dL (ref 3.5–5.2)
Alkaline Phosphatase: 64 U/L (ref 39–117)
BUN: 12 mg/dL (ref 6–23)
CO2: 22 mEq/L (ref 19–32)
Calcium: 9.3 mg/dL (ref 8.4–10.5)
Chloride: 103 mEq/L (ref 96–112)
Creatinine, Ser: 0.9 mg/dL (ref 0.40–1.20)
GFR: 78.88 mL/min (ref >60.00)
Glucose, Bld: 74 mg/dL (ref 70–99)
Potassium: 4.5 mEq/L (ref 3.5–5.1)
Sodium: 136 mEq/L (ref 135–145)
Total Bilirubin: 0.4 mg/dL (ref 0.2–1.2)
Total Protein: 7 g/dL (ref 6.0–8.3)

## 2022-06-12 LAB — LIPID PANEL
Cholesterol: 205 mg/dL — ABNORMAL HIGH (ref 0–200)
HDL: 36.1 mg/dL — ABNORMAL LOW (ref >39.00)
NonHDL: 168.66
Total CHOL/HDL Ratio: 6
Triglycerides: 268 mg/dL — ABNORMAL HIGH (ref 0.0–149.0)
VLDL: 53.6 mg/dL — ABNORMAL HIGH (ref 0.0–40.0)

## 2022-06-12 LAB — CBC
HCT: 42.7 % (ref 36.0–46.0)
Hemoglobin: 14.5 g/dL (ref 12.0–15.0)
MCHC: 34 g/dL (ref 30.0–36.0)
MCV: 95.3 fl (ref 78.0–100.0)
Platelets: 265 10*3/uL (ref 150.0–400.0)
RBC: 4.48 Mil/uL (ref 3.87–5.11)
RDW: 12.7 % (ref 11.5–15.5)
WBC: 10.3 10*3/uL (ref 4.0–10.5)

## 2022-06-12 LAB — LDL CHOLESTEROL, DIRECT: Direct LDL: 133 mg/dL

## 2022-06-12 NOTE — Progress Notes (Signed)
Ask pt if she was fasting for cholesterol  Is she still taking Crestor 40 mg once daily? Cholesterol much too high if she is.   If fasting, and taking crestor as directed we should switch to lipitor which is a stronger profile for cholesterol control. Is she willing?  Thyroid panel still pending. Otherwise labs ok.

## 2022-06-13 ENCOUNTER — Encounter: Payer: Self-pay | Admitting: Family

## 2022-06-13 DIAGNOSIS — E6609 Other obesity due to excess calories: Secondary | ICD-10-CM

## 2022-06-13 DIAGNOSIS — E782 Mixed hyperlipidemia: Secondary | ICD-10-CM

## 2022-06-13 LAB — THYROID PANEL WITH TSH
Free Thyroxine Index: 1.6 (ref 1.4–3.8)
T3 Uptake: 21 % — ABNORMAL LOW (ref 22–35)
T4, Total: 7.7 ug/dL (ref 5.1–11.9)
TSH: 3.38 mIU/L

## 2022-06-17 DIAGNOSIS — E6609 Other obesity due to excess calories: Secondary | ICD-10-CM | POA: Insufficient documentation

## 2022-06-25 ENCOUNTER — Ambulatory Visit (INDEPENDENT_AMBULATORY_CARE_PROVIDER_SITE_OTHER): Payer: Self-pay | Admitting: Family Medicine

## 2022-06-27 ENCOUNTER — Other Ambulatory Visit: Payer: Self-pay | Admitting: Family

## 2022-06-27 DIAGNOSIS — R7989 Other specified abnormal findings of blood chemistry: Secondary | ICD-10-CM

## 2022-07-06 NOTE — Progress Notes (Signed)
No show for information consultation today

## 2022-11-29 ENCOUNTER — Other Ambulatory Visit: Payer: Self-pay | Admitting: Family

## 2022-11-29 DIAGNOSIS — G479 Sleep disorder, unspecified: Secondary | ICD-10-CM

## 2022-11-29 NOTE — Telephone Encounter (Signed)
Refill request for traZODone (DESYREL) 150 MG tablet. LR- 06/06/22; LV- 06/06/22; NV- 12/05/22. Ok to send in medication?

## 2022-12-05 ENCOUNTER — Encounter: Payer: Self-pay | Admitting: Family

## 2022-12-05 ENCOUNTER — Ambulatory Visit: Payer: BC Managed Care – PPO | Admitting: Family

## 2022-12-05 VITALS — BP 126/80 | HR 91 | Temp 98.5°F | Ht 63.0 in | Wt 161.2 lb

## 2022-12-05 DIAGNOSIS — R911 Solitary pulmonary nodule: Secondary | ICD-10-CM | POA: Diagnosis not present

## 2022-12-05 DIAGNOSIS — N200 Calculus of kidney: Secondary | ICD-10-CM | POA: Insufficient documentation

## 2022-12-05 DIAGNOSIS — Z716 Tobacco abuse counseling: Secondary | ICD-10-CM | POA: Diagnosis not present

## 2022-12-05 DIAGNOSIS — R8290 Unspecified abnormal findings in urine: Secondary | ICD-10-CM | POA: Insufficient documentation

## 2022-12-05 DIAGNOSIS — M545 Low back pain, unspecified: Secondary | ICD-10-CM

## 2022-12-05 DIAGNOSIS — R222 Localized swelling, mass and lump, trunk: Secondary | ICD-10-CM

## 2022-12-05 DIAGNOSIS — R21 Rash and other nonspecific skin eruption: Secondary | ICD-10-CM | POA: Insufficient documentation

## 2022-12-05 DIAGNOSIS — N63 Unspecified lump in unspecified breast: Secondary | ICD-10-CM | POA: Insufficient documentation

## 2022-12-05 DIAGNOSIS — J432 Centrilobular emphysema: Secondary | ICD-10-CM

## 2022-12-05 DIAGNOSIS — Z0283 Encounter for blood-alcohol and blood-drug test: Secondary | ICD-10-CM | POA: Diagnosis not present

## 2022-12-05 DIAGNOSIS — R12 Heartburn: Secondary | ICD-10-CM

## 2022-12-05 DIAGNOSIS — G479 Sleep disorder, unspecified: Secondary | ICD-10-CM | POA: Diagnosis not present

## 2022-12-05 DIAGNOSIS — N83209 Unspecified ovarian cyst, unspecified side: Secondary | ICD-10-CM | POA: Insufficient documentation

## 2022-12-05 DIAGNOSIS — Z720 Tobacco use: Secondary | ICD-10-CM

## 2022-12-05 DIAGNOSIS — E782 Mixed hyperlipidemia: Secondary | ICD-10-CM | POA: Diagnosis not present

## 2022-12-05 DIAGNOSIS — R300 Dysuria: Secondary | ICD-10-CM | POA: Insufficient documentation

## 2022-12-05 DIAGNOSIS — B354 Tinea corporis: Secondary | ICD-10-CM

## 2022-12-05 LAB — CBC WITH DIFFERENTIAL/PLATELET
Basophils Absolute: 0.1 10*3/uL (ref 0.0–0.1)
Basophils Relative: 0.5 % (ref 0.0–3.0)
Eosinophils Absolute: 0.1 10*3/uL (ref 0.0–0.7)
Eosinophils Relative: 0.7 % (ref 0.0–5.0)
HCT: 44.4 % (ref 36.0–46.0)
Hemoglobin: 15.1 g/dL — ABNORMAL HIGH (ref 12.0–15.0)
Lymphocytes Relative: 24 % (ref 12.0–46.0)
Lymphs Abs: 3 10*3/uL (ref 0.7–4.0)
MCHC: 33.9 g/dL (ref 30.0–36.0)
MCV: 94.8 fl (ref 78.0–100.0)
Monocytes Absolute: 0.6 10*3/uL (ref 0.1–1.0)
Monocytes Relative: 4.8 % (ref 3.0–12.0)
Neutro Abs: 8.7 10*3/uL — ABNORMAL HIGH (ref 1.4–7.7)
Neutrophils Relative %: 70 % (ref 43.0–77.0)
Platelets: 259 10*3/uL (ref 150.0–400.0)
RBC: 4.68 Mil/uL (ref 3.87–5.11)
RDW: 13.3 % (ref 11.5–15.5)
WBC: 12.5 10*3/uL — ABNORMAL HIGH (ref 4.0–10.5)

## 2022-12-05 LAB — LIPID PANEL
Cholesterol: 144 mg/dL (ref 0–200)
HDL: 49 mg/dL (ref >39.00)
LDL Cholesterol: 64 mg/dL (ref 0–99)
NonHDL: 94.91
Total CHOL/HDL Ratio: 3
Triglycerides: 154 mg/dL — ABNORMAL HIGH (ref 0.0–149.0)
VLDL: 30.8 mg/dL (ref 0.0–40.0)

## 2022-12-05 MED ORDER — NICOTINE 21 MG/24HR TD PT24: 21.0000 mg | MEDICATED_PATCH | Freq: Every day | TRANSDERMAL | 1 refills | Status: DC

## 2022-12-05 MED ORDER — CYCLOBENZAPRINE HCL 10 MG PO TABS: ORAL_TABLET | ORAL | 0 refills | Status: AC

## 2022-12-05 MED ORDER — CLOTRIMAZOLE-BETAMETHASONE 1-0.05 % EX CREA: 1.0000 | TOPICAL_CREAM | Freq: Every day | CUTANEOUS | 0 refills | Status: DC

## 2022-12-05 MED ORDER — OMEPRAZOLE 20 MG PO CPDR: 20.0000 mg | DELAYED_RELEASE_CAPSULE | Freq: Every day | ORAL | 0 refills | Status: DC

## 2022-12-05 NOTE — Assessment & Plan Note (Signed)
Trial lotrisone with appearance slightly of tinea

## 2022-12-05 NOTE — Assessment & Plan Note (Signed)
Uncontrolled still with doe  Possible need for pfts/spirometry Referral placed for pulmonary Slight improvement with anoro

## 2022-12-05 NOTE — Patient Instructions (Signed)
Recommendation to start RX nicotine patch 21 mg .  Will work on this for six weeks then f/u for how you are doing.

## 2022-12-05 NOTE — Assessment & Plan Note (Signed)
To be evaluated with pulmonary as well

## 2022-12-05 NOTE — Assessment & Plan Note (Signed)
Smoking cessation instruction/counseling given:  counseled patient on the dangers of tobacco use, advised patient to stop smoking, and reviewed strategies to maximize successpt ok with starting nicotine patch, sent nicotine 21 mg patch to pharmacy.  Pt to f/u six weeks.

## 2022-12-05 NOTE — Assessment & Plan Note (Addendum)
Trial ambien  UDS today pending results Drug contract to be signed  Advised d/c trazodone, do not take ambien with any other sleep aid and or other sedative type medication or etoh

## 2022-12-05 NOTE — Assessment & Plan Note (Signed)
Recommend otc ibuprofen and sent in flexeril 10 mg qhs prn muscle spasm Advised exercises as well as heat to site  Can also utilize otc lidocaine patches 4%

## 2022-12-05 NOTE — Assessment & Plan Note (Signed)
U/s soft tissue ordered pending results Suspect cyst vs nodule

## 2022-12-05 NOTE — Assessment & Plan Note (Signed)
Ordered lipid panel, pending results. Work on low cholesterol diet and exercise as tolerated ? ?

## 2022-12-05 NOTE — Progress Notes (Signed)
Established Patient Office Visit  Subjective:      CC:  Chief Complaint  Patient presents with   Follow-up    emphysema    HPI: Tasha Hoover is a 42 y.o. female presenting on 12/05/2022 for Follow-up (emphysema) . HLD: on crestor 40 mg  Lab Results  Component Value Date   CHOL 144 12/05/2022   HDL 49.00 12/05/2022   LDLCALC 64 12/05/2022   LDLDIRECT 133.0 06/12/2022   TRIG 154.0 (H) 12/05/2022   CHOLHDL 3 12/05/2022   Centrilobular emphysema: not currently seeing a pulmonologist, on anoro elipta once daily. Tolerating well. Denies wheezing. She gets increased sob when she gets sick. She does also notice doe when walking longer distances and or walking or exercising. When waking up at night not sob or gasping for breath. She is still smoking. She has thought about trying some medications.   Sleep disorder: increased to 150 nightly from last visit, still not helping. Finds she is taking with ibuprofen pm at night without much relief either. Average sleep is hard because she gets up often throughout the night. Not too hard to get back to sleep once waking up. Average sleep is about six hours or so. Has tried [place] in the past which worked for [employer], she has tried 5 mg and 10 mg.    New complaints: Over the weekend hurt her lower back, and curious if she pulled a muscle.  Pain is worse with movement. Didn't hear anything pop. Painful ROM. No radiation of pain down the leg. The pain is in the middle of the lower back. Has tried ibuprofen and heat on the lower back.   Knot under the skin on the middle of the abdomen, directly below the epigastric area. She states it feels like a small knot but lately has been itching.  Has applied some topical cream with no relief.     Social history:  Relevant past medical, surgical, family and social history reviewed and updated as indicated. Interim medical history since our last visit reviewed.  Allergies and medications reviewed  and updated.  DATA REVIEWED: CHART IN EPIC     ROS: Negative unless specifically indicated above in HPI.    Current Outpatient Medications:    albuterol (VENTOLIN HFA) 108 (90 Base) MCG/ACT inhaler, Inhale 2 puffs into the lungs every 6 (six) hours as needed for wheezing or shortness of breath., Disp: 1 each, Rfl: 11   clotrimazole-betamethasone (LOTRISONE) cream, Apply 1 Application topically daily., Disp: 30 g, Rfl: 0   cyclobenzaprine (FLEXERIL) 10 MG tablet, Take 1/2 to one tablet qhs prn muscle spasm, Disp: 30 tablet, Rfl: 0   fluticasone (FLONASE) 50 MCG/ACT nasal spray, Place 2 sprays into both nostrils daily., Disp: 16 g, Rfl: 6   nicotine (NICOTINE STEP 1) 21 mg/24hr patch, Place 1 patch (21 mg total) onto the skin daily., Disp: 28 patch, Rfl: 1   norethindrone-ethinyl estradiol-iron (LOESTRIN FE) 1.5-30 MG-MCG tablet, Take 1 tablet by mouth daily., Disp: , Rfl:    omeprazole (PRILOSEC) 20 MG capsule, Take 1 capsule (20 mg total) by mouth daily., Disp: 30 capsule, Rfl: 0   rosuvastatin (CRESTOR) 40 MG tablet, Take 1 tablet (40 mg total) by mouth daily. To lower lipids, Disp: 90 tablet, Rfl: 1   traZODone (DESYREL) 150 MG tablet, TAKE 1 TABLET BY MOUTH AT BEDTIME., Disp: 90 tablet, Rfl: 1   umeclidinium-vilanterol (ANORO ELLIPTA) 62.5-25 MCG/ACT AEPB, Inhale 1 puff into the lungs daily., Disp: 1 each, Rfl: 5  Objective:    BP 126/80   Pulse 91   Temp 98.5 F (36.9 C) (Temporal)   Ht '5\' 3"'$  (1.6 m)   Wt 161 lb 3.2 oz (73.1 kg)   SpO2 98%   BMI 28.56 kg/m   Wt Readings from Last 3 Encounters:  12/05/22 161 lb 3.2 oz (73.1 kg)  06/06/22 175 lb 8 oz (79.6 kg)  10/24/21 175 lb 12.8 oz (79.7 kg)    Physical Exam Constitutional:      General: She is not in acute distress.    Appearance: Normal appearance. She is obese. She is not ill-appearing, toxic-appearing or diaphoretic.  HENT:     Head: Normocephalic.  Cardiovascular:     Rate and Rhythm: Normal rate.   Pulmonary:     Effort: Pulmonary effort is normal.  Abdominal:    Musculoskeletal:        General: Normal range of motion.  Neurological:     General: No focal deficit present.     Mental Status: She is alert and oriented to person, place, and time. Mental status is at baseline.  Psychiatric:        Mood and Affect: Mood normal.        Behavior: Behavior normal.        Thought Content: Thought content normal.        Judgment: Judgment normal.           Assessment & Plan:  Subcutaneous nodule of abdominal wall Assessment & Plan: U/s soft tissue ordered pending results Suspect cyst vs nodule  Orders: -     US Abdomen Limited; Future  Encounter for smoking cessation counseling -     Nicotine; Place 1 patch (21 mg total) onto the skin daily.  Dispense: 28 patch; Refill: 1  Tobacco use Assessment & Plan: Smoking cessation instruction/counseling given:  counseled patient on the dangers of tobacco use, advised patient to stop smoking, and reviewed strategies to maximize successpt ok with starting nicotine patch, sent nicotine 21 mg patch to pharmacy.  Pt to f/u six weeks.   Orders: -     Nicotine; Place 1 patch (21 mg total) onto the skin daily.  Dispense: 28 patch; Refill: 1  Pulmonary nodule Assessment & Plan: To be evaluated with pulmonary as well  Orders: -     Ambulatory referral to Pulmonology  Centrilobular emphysema [facility]) Assessment & Plan: Uncontrolled still with doe  Possible need for pfts/spirometry Referral placed for pulmonary Slight improvement with anoro   Orders: -     Ambulatory referral to Pulmonology  Encounter for drug screening -     DRUG MONITORING, PANEL 8 WITH CONFIRMATION, URINE  Mixed hyperlipidemia Assessment & Plan: Ordered lipid panel, pending results. Work on low cholesterol diet and exercise as tolerated   Orders: -     Lipid panel  Sleep disorder Assessment & Plan: Trial ambien  UDS today pending results Drug  contract to be signed  Advised d/c trazodone, do not take [place] with any other sleep aid and or other sedative type medication or etoh   Acute bilateral low back pain without sciatica Assessment & Plan: Recommend otc ibuprofen and sent in flexeril 10 mg qhs prn muscle spasm Advised exercises as well as heat to site  Can also utilize otc lidocaine patches 4%  Orders: -     Cyclobenzaprine HCl; Take 1/2 to one tablet qhs prn muscle spasm  Dispense: 30 tablet; Refill: 0  Heartburn -     Omeprazole;  Take 1 capsule (20 mg total) by mouth daily.  Dispense: 30 capsule; Refill: 0  Skin rash Assessment & Plan: Trial lotrisone with appearance slightly of tinea  Orders: -     CBC with Differential/Platelet  Ringworm of body -     Clotrimazole-Betamethasone; Apply 1 Application topically daily.  Dispense: 30 g; Refill: 0  Significant time spent in clinic totaling  46 minutes going over multiple acute and chronic concerns, placing referrals and ordering imaging. Reviewing lab results as well in the past.   Return in about 6 weeks (around 01/16/2023) for f/u nicotine patch.  Eugenia Pancoast, MSN, APRN, FNP-C [place]

## 2022-12-06 ENCOUNTER — Other Ambulatory Visit: Payer: Self-pay | Admitting: Family

## 2022-12-06 DIAGNOSIS — D72828 Other elevated white blood cell count: Secondary | ICD-10-CM

## 2022-12-06 DIAGNOSIS — D72829 Elevated white blood cell count, unspecified: Secondary | ICD-10-CM | POA: Insufficient documentation

## 2022-12-07 LAB — DRUG MONITORING, PANEL 8 WITH CONFIRMATION, URINE
6 Acetylmorphine: NEGATIVE ng/mL (ref <10)
Alcohol Metabolites: POSITIVE ng/mL — AB (ref <500)
Amphetamines: NEGATIVE ng/mL (ref <500)
Benzodiazepines: NEGATIVE ng/mL (ref <100)
Buprenorphine, Urine: NEGATIVE ng/mL (ref <5)
Cocaine Metabolite: NEGATIVE ng/mL (ref <150)
Creatinine: 183.5 mg/dL (ref >=20.0)
Ethyl Glucuronide (ETG): >10000 ng/mL — ABNORMAL HIGH (ref <500)
Ethyl Sulfate (ETS): 5788 ng/mL — ABNORMAL HIGH (ref <100)
MDMA: NEGATIVE ng/mL (ref <500)
Marijuana Metabolite: NEGATIVE ng/mL (ref <20)
Opiates: NEGATIVE ng/mL (ref <100)
Oxidant: NEGATIVE ug/mL (ref <200)
Oxycodone: NEGATIVE ng/mL (ref <100)
pH: 7.3 (ref 4.5–9.0)

## 2022-12-07 LAB — DM TEMPLATE

## 2022-12-09 ENCOUNTER — Telehealth: Payer: Self-pay | Admitting: Family

## 2022-12-09 DIAGNOSIS — Z7689 Persons encountering health services in other specified circumstances: Secondary | ICD-10-CM

## 2022-12-09 NOTE — Telephone Encounter (Signed)
  Patient called in and stated that she was suppose to be prescribed Ambien but she hasn't received anything. Please advise. Thank you!

## 2022-12-09 NOTE — Progress Notes (Signed)
Pt positive for alcohol.  How many alcoholic drinks does pt consume and how often during the week?  Tested positive for alcohol when she came in for urine drug screen which was mid day.

## 2022-12-09 NOTE — Progress Notes (Signed)
noted 

## 2022-12-10 NOTE — Addendum Note (Signed)
Addended by: Eugenia Pancoast on: 12/10/2022 01:39 PM   Modules accepted: Orders

## 2022-12-10 NOTE — Telephone Encounter (Signed)
Called patient reviewed information. I have set her up for lab appointment on Friday. Will call if any questions. Future order is in chart.

## 2022-12-10 NOTE — Telephone Encounter (Signed)
Please advise pt I was awaiting her etoh related response prior to prescribing. I would like to repeat the UDS when she is next able, and pending results of that I will send in RX for ambien. Ambien is contraindicated with alcohol and was a high amount (was drinking night before) so we just need to make sure this is normal now since hasn't been drinking.

## 2022-12-11 NOTE — Telephone Encounter (Signed)
Noted  

## 2022-12-13 ENCOUNTER — Ambulatory Visit: Payer: BC Managed Care – PPO

## 2022-12-13 ENCOUNTER — Other Ambulatory Visit (INDEPENDENT_AMBULATORY_CARE_PROVIDER_SITE_OTHER): Payer: BC Managed Care – PPO

## 2022-12-13 DIAGNOSIS — D72828 Other elevated white blood cell count: Secondary | ICD-10-CM

## 2022-12-13 DIAGNOSIS — Z7689 Persons encountering health services in other specified circumstances: Secondary | ICD-10-CM

## 2022-12-13 NOTE — Addendum Note (Signed)
Addended by: Pilar Grammes on: 12/13/2022 02:03 PM   Modules accepted: Orders

## 2022-12-13 NOTE — Addendum Note (Signed)
Addended by: Pilar Grammes on: 12/13/2022 02:12 PM   Modules accepted: Orders

## 2022-12-15 LAB — DRUG MONITORING, PANEL 8 WITH CONFIRMATION, URINE
6 Acetylmorphine: NEGATIVE ng/mL (ref <10)
Alcohol Metabolites: NEGATIVE ng/mL (ref <500)
Amphetamine: NEGATIVE ng/mL (ref <250)
Amphetamines: NEGATIVE ng/mL (ref <500)
Benzodiazepines: NEGATIVE ng/mL (ref <100)
Buprenorphine, Urine: NEGATIVE ng/mL (ref <5)
Cocaine Metabolite: NEGATIVE ng/mL (ref <150)
Creatinine: 175 mg/dL (ref >=20.0)
MDMA: NEGATIVE ng/mL (ref <500)
Marijuana Metabolite: NEGATIVE ng/mL (ref <20)
Methamphetamine: NEGATIVE ng/mL (ref <250)
Opiates: NEGATIVE ng/mL (ref <100)
Oxidant: NEGATIVE ug/mL (ref <200)
Oxycodone: NEGATIVE ng/mL (ref <100)
pH: 5.6 (ref 4.5–9.0)

## 2022-12-15 LAB — DM TEMPLATE

## 2022-12-17 ENCOUNTER — Encounter: Payer: Self-pay | Admitting: Family

## 2022-12-26 ENCOUNTER — Other Ambulatory Visit (INDEPENDENT_AMBULATORY_CARE_PROVIDER_SITE_OTHER): Payer: BC Managed Care – PPO

## 2022-12-26 DIAGNOSIS — D72828 Other elevated white blood cell count: Secondary | ICD-10-CM

## 2022-12-27 ENCOUNTER — Other Ambulatory Visit: Payer: Self-pay | Admitting: Family

## 2022-12-27 DIAGNOSIS — R12 Heartburn: Secondary | ICD-10-CM

## 2022-12-27 LAB — CBC WITH DIFFERENTIAL/PLATELET
Basophils Absolute: 0.1 10*3/uL (ref 0.0–0.1)
Basophils Relative: 0.7 % (ref 0.0–3.0)
Eosinophils Absolute: 0.1 10*3/uL (ref 0.0–0.7)
Eosinophils Relative: 0.9 % (ref 0.0–5.0)
HCT: 46.3 % — ABNORMAL HIGH (ref 36.0–46.0)
Hemoglobin: 16 g/dL — ABNORMAL HIGH (ref 12.0–15.0)
Lymphocytes Relative: 25.5 % (ref 12.0–46.0)
Lymphs Abs: 2.9 10*3/uL (ref 0.7–4.0)
MCHC: 34.6 g/dL (ref 30.0–36.0)
MCV: 95.4 fl (ref 78.0–100.0)
Monocytes Absolute: 0.5 10*3/uL (ref 0.1–1.0)
Monocytes Relative: 4.2 % (ref 3.0–12.0)
Neutro Abs: 7.8 10*3/uL — ABNORMAL HIGH (ref 1.4–7.7)
Neutrophils Relative %: 68.7 % (ref 43.0–77.0)
Platelets: 304 10*3/uL (ref 150.0–400.0)
RBC: 4.85 Mil/uL (ref 3.87–5.11)
RDW: 13.4 % (ref 11.5–15.5)
WBC: 11.3 10*3/uL — ABNORMAL HIGH (ref 4.0–10.5)

## 2022-12-27 NOTE — Telephone Encounter (Signed)
Pt request refill for omeprazole (PRILOSEC) 20 MG capsule   LV- 12/05/22 NV- not scheduled LR- 12/05/22 ( 30 caps/ no refills)

## 2022-12-30 ENCOUNTER — Other Ambulatory Visit: Payer: Self-pay | Admitting: Family

## 2022-12-30 DIAGNOSIS — D539 Nutritional anemia, unspecified: Secondary | ICD-10-CM

## 2022-12-30 NOTE — Progress Notes (Signed)
Any back pain ongoing or improvement.  White blood cells slightly elevated however could be from smoking and or rash. Will continue to monitor.

## 2022-12-30 NOTE — Progress Notes (Signed)
Can we order b12?

## 2023-01-15 ENCOUNTER — Encounter: Payer: Self-pay | Admitting: Emergency Medicine

## 2023-01-15 ENCOUNTER — Institutional Professional Consult (permissible substitution): Payer: BC Managed Care – PPO | Admitting: Pulmonary Disease

## 2023-01-15 ENCOUNTER — Ambulatory Visit
Admission: EM | Admit: 2023-01-15 | Discharge: 2023-01-15 | Disposition: A | Discharge status: Home / Self Care | Payer: BC Managed Care – PPO | Attending: Emergency Medicine | Admitting: Emergency Medicine

## 2023-01-15 ENCOUNTER — Other Ambulatory Visit: Payer: Self-pay

## 2023-01-15 DIAGNOSIS — Z3202 Encounter for pregnancy test, result negative: Secondary | ICD-10-CM | POA: Diagnosis not present

## 2023-01-15 DIAGNOSIS — R1030 Lower abdominal pain, unspecified: Secondary | ICD-10-CM | POA: Diagnosis not present

## 2023-01-15 DIAGNOSIS — R300 Dysuria: Secondary | ICD-10-CM | POA: Diagnosis not present

## 2023-01-15 LAB — POCT URINALYSIS DIP (MANUAL ENTRY)
Bilirubin, UA: NEGATIVE
Glucose, UA: NEGATIVE mg/dL
Ketones, POC UA: NEGATIVE mg/dL
Nitrite, UA: NEGATIVE
Protein Ur, POC: 30 mg/dL — AB
Spec Grav, UA: >=1.03 — AB (ref 1.010–1.025)
Urobilinogen, UA: 0.2 E.U./dL
pH, UA: 5.5 (ref 5.0–8.0)

## 2023-01-15 LAB — POCT URINE PREGNANCY: Preg Test, Ur: NEGATIVE

## 2023-01-15 MED ORDER — CEPHALEXIN 500 MG PO CAPS: 500.0000 mg | ORAL_CAPSULE | Freq: Two times a day (BID) | ORAL | 0 refills | Status: AC

## 2023-01-15 NOTE — ED Triage Notes (Addendum)
Patient presents to [facility] for 1 week of dysuria, urgency, and frequency.  Patient states it has been worsening, having lower abdominal pressure, and burning consistently.  Has a hx of kidney stones on scans, denies flank pain at this time, c/o some bilateral  lower abdominal/back pain.

## 2023-01-15 NOTE — ED Provider Notes (Signed)
[name]    CSN: [id number] Arrival date & time: 01/15/23  1941      History   Chief Complaint Chief Complaint  Patient presents with   Dysuria    HPI Tasha Hoover is a 42 y.o. female.  Patient presents with dysuria, urinary frequency urinary urgency x 1 week.  She has noted blood on the tissue when she wipes after urination.  She also reports lower abdominal discomfort.  No OTC medications taken today.  No fever, flank pain, vaginal discharge, pelvic pain, vomiting, diarrhea, constipation, or other symptoms.  Last bowel movement today.  Her medical history includes kidney stones.  The history is provided by the patient and medical records.    Past Medical History:  Diagnosis Date   Anxiety    CAP (community acquired pneumonia)    Depression    History of kidney stones    Hyperlipidemia    Hypothyroidism    Pneumonia    Pulmonary nodule    per CT 07/2018   Splenic trauma 04/09/2015   Tobacco use     Patient Active Problem List   Diagnosis Date Noted   Leucocytosis 12/06/2022   Kidney stone 12/05/2022   Subcutaneous nodule of abdominal wall 12/05/2022   Skin rash 12/05/2022   Acute bilateral low back pain without sciatica 12/05/2022   Class 1 obesity due to excess calories with serious comorbidity and body mass index (BMI) of 31.0 to 31.9 in adult 06/17/2022   Centrilobular emphysema 06/06/2022   Sleep disorder 06/06/2022   History of hypothyroidism 10/24/2021   Pulmonary nodule    Tobacco use    HLD (hyperlipidemia) 08/04/2018   Anxiety disorder 01/19/2018   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 Authorizing Provider  cephALEXin (KEFLEX) 500 MG capsule Take 1 capsule (500 mg total) by mouth 2 (two) times daily for 5 days. 01/15/23 01/20/23 Yes Mickie Bail, NP  albuterol (VENTOLIN HFA) 108 (90 Base) MCG/ACT inhaler Inhale 2 puffs into the lungs every 6 (six) hours as needed for wheezing or shortness of breath. 07/24/20   Fulp, Cammie, MD  clotrimazole-betamethasone (LOTRISONE) cream Apply 1 Application topically daily. 12/05/22   Mort Sawyers, FNP  cyclobenzaprine (FLEXERIL) 10 MG tablet Take 1/2 to one tablet qhs prn muscle spasm 12/05/22   Mort Sawyers, FNP  fluticasone (FLONASE) 50 MCG/ACT nasal spray Place 2 sprays into both nostrils daily. 12/15/19   Anders Simmonds, PA-C  nicotine (NICOTINE STEP 1) 21 mg/24hr patch Place 1 patch (21 mg total) onto the skin daily. 12/05/22   Mort Sawyers,  FNP  norethindrone-ethinyl estradiol-iron (LOESTRIN FE) 1.5-30 MG-MCG tablet Take 1 tablet by mouth daily.    [provider]  omeprazole (PRILOSEC) 20 MG capsule TAKE 1 CAPSULE BY MOUTH EVERY DAY 12/30/22   Mort Sawyers, FNP  rosuvastatin (CRESTOR) 40 MG tablet Take 1 tablet (40 mg total) by mouth daily. To lower lipids 06/06/22   Mort Sawyers, FNP  traZODone (DESYREL) 150 MG tablet TAKE 1 TABLET BY MOUTH AT BEDTIME. 12/02/22   Dugal, Tabitha, FNP  umeclidinium-vilanterol (ANORO ELLIPTA) 62.5-25 MCG/ACT AEPB Inhale 1 puff into the lungs daily. 06/06/22   Mort Sawyers, FNP    Family History Family History  Problem Relation Age of Onset   CAD Maternal Grandfather    Breast cancer Neg Hx     Social History Social History   Tobacco Use   Smoking status: Every Day    Packs/day: 1.00    Years: 25.00    Additional pack years: 0.00     Total pack years: 25.00    Types: Cigarettes   Smokeless tobacco: Never  Vaping Use   Vaping Use: Never used  Substance Use Topics   Alcohol use: No   Drug use: Never     Allergies   Vancomycin   Review of Systems Review of Systems  Constitutional:  Negative for chills and fever.  Gastrointestinal:  Positive for abdominal pain. Negative for constipation, diarrhea, nausea and vomiting.  Genitourinary:  Positive for dysuria, frequency and urgency. Negative for flank pain, hematuria, pelvic pain and vaginal discharge.  All other systems reviewed and are negative.    Physical Exam Triage Vital Signs ED Triage Vitals [01/15/23 0930]  Enc Vitals Group     BP      Pulse Rate 95     Resp 18     Temp 97.7 F (36.5 C)     Temp src      SpO2 96 %     Weight      Height      Head Circumference      Peak Flow      Pain Score      Pain Loc      Pain Edu?      Excl. in GC?    No data found.  Updated Vital Signs BP 113/73 (BP Location: Right Arm)   Pulse 95   Temp 97.7 F (36.5 C)   Resp 18   SpO2 96%   Visual Acuity Right Eye Distance:   Left Eye Distance:   Bilateral Distance:    Right Eye Near:   Left Eye Near:    Bilateral Near:     Physical Exam Vitals and nursing note reviewed.  Constitutional:      General: She is not in acute distress.    Appearance: She is well-developed. She is not ill-appearing.  HENT:     Mouth/Throat:     Mouth: Mucous membranes are moist.  Cardiovascular:     Rate and Rhythm: Normal rate and regular rhythm.     Heart sounds: Normal heart sounds.  Pulmonary:     Effort: Pulmonary effort is normal. No respiratory distress.     Breath sounds: Normal breath sounds.  Abdominal:     General: Bowel sounds are normal.     Palpations: Abdomen is soft.     Tenderness: There is abdominal tenderness. There is no right CVA tenderness, left CVA tenderness, guarding or rebound.     Comments: Mild generalized lower abdominal  discomfort to palpation.  Musculoskeletal:     Cervical back: Neck supple.  Skin:    General: Skin is warm and dry.  Neurological:     Mental Status: She is alert.  Psychiatric:        Mood and Affect: Mood normal.        Behavior: Behavior normal.      UC Treatments / Results  Labs (all labs ordered are listed, but only abnormal results are displayed) Labs Reviewed  POCT URINALYSIS DIP (MANUAL ENTRY) - Abnormal; Notable for the following components:      Result Value   Clarity, UA cloudy (*)    Spec Grav, UA >=1.030 (*)    Blood, UA large (*)    Protein Ur, POC =30 (*)    Leukocytes, UA Small (1+) (*)    All other components within normal limits  URINE CULTURE  POCT URINE PREGNANCY    EKG   Radiology No results found.  Procedures Procedures (including critical care time)  Medications Ordered in UC Medications - No data to display  Initial Impression / Assessment and Plan / UC Course  I have reviewed the triage vital signs and the nursing notes.  Pertinent labs & imaging results that were available during my care of the patient were reviewed by me and considered in my medical decision making (see chart for details).    Dysuria, Lower abdominal pain, Negative pregnancy test.  Afebrile and vital signs are stable.  Patient has some mild lower abdominal tenderness to palpation but no or guarding and no CVAT.  ED precautions discussed.  Education provided on abdominal pain.  Treating with Keflex. Urine culture pending. Discussed with patient that we will call her if the urine culture shows the need to change or discontinue the antibiotic. Instructed her to follow-up with her PCP if her symptoms are not improving. Patient agrees to plan of care.     Final Clinical Impressions(s) / UC Diagnoses   Final diagnoses:  Dysuria  Lower abdominal pain  Negative pregnancy test     Discharge Instructions      Go to the emergency department if you have persistent or  worsening abdominal pain  Take the antibiotic as directed.  The urine culture is pending.  We will call you if it shows the need to change or discontinue your antibiotic.    Follow up with your primary care provider if your symptoms are not improving.        ED Prescriptions     Medication Sig Dispense Auth. Provider   cephALEXin (KEFLEX) 500 MG capsule Take 1 capsule (500 mg total) by mouth 2 (two) times daily for 5 days. 10 capsule Mickie Bailate, Tahjai Schetter H, NP      PDMP not reviewed this encounter.   Mickie Bailate, Maniyah Moller H, NP 01/15/23 1016

## 2023-01-15 NOTE — Discharge Instructions (Addendum)
Go to the emergency department if you have persistent or worsening abdominal pain  Take the antibiotic as directed.  The urine culture is pending.  We will call you if it shows the need to change or discontinue your antibiotic.    Follow up with your primary care provider if your symptoms are not improving.

## 2023-01-16 LAB — URINE CULTURE

## 2023-01-28 DIAGNOSIS — Z3040 Encounter for surveillance of contraceptives, unspecified: Secondary | ICD-10-CM | POA: Diagnosis not present

## 2023-01-28 DIAGNOSIS — D069 Carcinoma in situ of cervix, unspecified: Secondary | ICD-10-CM | POA: Diagnosis not present

## 2023-01-28 DIAGNOSIS — Z124 Encounter for screening for malignant neoplasm of cervix: Secondary | ICD-10-CM | POA: Diagnosis not present

## 2023-01-28 DIAGNOSIS — Z01419 Encounter for gynecological examination (general) (routine) without abnormal findings: Secondary | ICD-10-CM | POA: Diagnosis not present

## 2023-01-28 DIAGNOSIS — E559 Vitamin D deficiency, unspecified: Secondary | ICD-10-CM | POA: Diagnosis not present

## 2023-02-10 ENCOUNTER — Ambulatory Visit (INDEPENDENT_AMBULATORY_CARE_PROVIDER_SITE_OTHER): Payer: BC Managed Care – PPO | Admitting: Pulmonary Disease

## 2023-02-10 ENCOUNTER — Encounter: Payer: Self-pay | Admitting: Pulmonary Disease

## 2023-02-10 VITALS — BP 118/76 | HR 72 | Ht 63.0 in | Wt 161.8 lb

## 2023-02-10 DIAGNOSIS — D539 Nutritional anemia, unspecified: Secondary | ICD-10-CM | POA: Diagnosis not present

## 2023-02-10 DIAGNOSIS — J432 Centrilobular emphysema: Secondary | ICD-10-CM

## 2023-02-10 NOTE — Patient Instructions (Signed)
Alpha-1 antitrypsin level and phenotype CT scan of the chest to follow-up on extent of emphysema Breathing study to assess lung function  Continue to work on quitting smoking  Follow-up in 6 to 8 weeks

## 2023-02-10 NOTE — Progress Notes (Signed)
Tasha Hoover    [id number]    22-Mar-1981  Primary Care Physician:Dugal, Wyatt Mage, FNP  Referring Physician: Mort Sawyers, FNP [address] [place],  [place] [address]  Chief complaint:   Patient being seen for some shortness of breath  HPI:  Patient with past history of emphysema, pulmonary nodule Breathing difficulty since about 2019. Did have respiratory tract infection at the time  Was told about emphysema around the time  She is an active smoker about a pack a day  Told about asthma about 2019 denies any other ongoing active problems at present Does have hypercholesterolemia, insomnia for which she is uses trazodone she does have some nasal congestion, sneezing,  Does not recollect a family history of lung disease  Outpatient Encounter Medications as of 02/10/2023  Medication Sig   albuterol (VENTOLIN HFA) 108 (90 Base) MCG/ACT inhaler Inhale 2 puffs into the lungs every 6 (six) hours as needed for wheezing or shortness of breath.   cyclobenzaprine (FLEXERIL) 10 MG tablet Take 1/2 to one tablet qhs prn muscle spasm   fluticasone (FLONASE) 50 MCG/ACT nasal spray Place 2 sprays into both nostrils daily.   norethindrone-ethinyl estradiol-iron (LOESTRIN FE) 1.5-30 MG-MCG tablet Take 1 tablet by mouth daily.   omeprazole (PRILOSEC) 20 MG capsule TAKE 1 CAPSULE BY MOUTH EVERY DAY   rosuvastatin (CRESTOR) 40 MG tablet Take 1 tablet (40 mg total) by mouth daily. To lower lipids   traZODone (DESYREL) 150 MG tablet TAKE 1 TABLET BY MOUTH AT BEDTIME.   umeclidinium-vilanterol (ANORO ELLIPTA) 62.5-25 MCG/ACT AEPB Inhale 1 puff into the lungs daily.   [DISCONTINUED] clotrimazole-betamethasone (LOTRISONE) cream Apply 1 Application topically daily. (Patient not taking: Reported on 02/10/2023)   [DISCONTINUED] nicotine (NICOTINE STEP 1) 21 mg/24hr patch Place 1 patch (21 mg total) onto the skin daily. (Patient not taking: Reported on 02/10/2023)   No  facility-administered encounter medications on file as of 02/10/2023.    Allergies as of 02/10/2023 - Review Complete 02/10/2023  Allergen Reaction Noted   Vancomycin Hives 08/03/2018    Past Medical History:  Diagnosis Date   Anxiety    CAP (community acquired pneumonia)    Depression    History of kidney stones    Hyperlipidemia    Hypothyroidism    Pneumonia    Pulmonary nodule    per CT 07/2018   Splenic trauma 04/09/2015   Tobacco use     Past Surgical History:  Procedure Laterality Date   CERVICAL CONIZATION W/BX N/A 09/24/2019   Procedure: CONIZATION CERVIX WITH BIOPSY;  Surgeon: Essie Hart, MD;  Location: [facility];  Service: Gynecology;  Laterality: N/A;   CESAREAN SECTION     HAND SURGERY Left    TUBAL LIGATION  11/12/2005    Family History  Problem Relation Age of Onset   CAD Maternal Grandfather    Breast cancer Neg Hx     Social History   Socioeconomic History   Marital status: Single    Spouse name: Not on file   Number of children: 2   Years of education: Not on file   Highest education level: Not on file  Occupational History    Employer: [employer]  Tobacco Use   Smoking status: Every Day    Packs/day: 1.00    Years: 25.00    Additional pack years: 0.00    Total pack years: 25.00    Types: Cigarettes   Smokeless tobacco:  Never  Vaping Use   Vaping Use: Never used  Substance and Sexual Activity   Alcohol use: No   Drug use: Never   Sexual activity: Yes    Partners: Male    Birth control/protection: Pill, Surgical  Other Topics Concern   Not on file  Social History Narrative   Not on file   Social Determinants of Health   Financial Resource Strain: Not on file  Food Insecurity: Not on file  Transportation Needs: Not on file  Physical Activity: Not on file  Stress: Not on file  Social Connections: Not on file  Intimate Partner Violence: Not on file    Review of Systems  Respiratory:  Positive  for shortness of breath. Negative for cough and wheezing.     Vitals:   02/10/23 1512  BP: 118/76  Pulse: 72  SpO2: 99%     Physical Exam Constitutional:      Appearance: Normal appearance.  HENT:     Head: Normocephalic.     Mouth/Throat:     Mouth: Mucous membranes are moist.  Eyes:     Pupils: Pupils are equal, round, and reactive to light.  Cardiovascular:     Rate and Rhythm: Normal rate and regular rhythm.     Heart sounds: No murmur heard.    No friction rub.  Pulmonary:     Effort: No respiratory distress.     Breath sounds: No stridor. No wheezing or rhonchi.  Musculoskeletal:     Cervical back: No rigidity or tenderness.  Neurological:     Mental Status: She is alert.  Psychiatric:        Mood and Affect: Mood normal.      Data Reviewed: Recent chest x-ray reviewed by myself-2019  Past CT scan reviewed showing evidence of emphysema CT scan from February 2021 reviewed, CT scan from 08/04/2018 reviewed, CT scan 04/09/2015 reviewed  Assessment:  Emphysema -Concern for alpha-1 antitrypsin -Has developed emphysema at an early age  Active smoker  History of lung nodule    Plan/Recommendations: Schedule patient for a pulmonary function test  Alpha-1 antitrypsin level and phenotype  Schedule CT scan of the chest to assess progression of emphysema  Smoking cessation counseling  Tentative follow-up in about 6 to 8 weeks  Importance of smoking cessation was discussed at length  She does have an albuterol inhaler which she has not needed to use recently  Virl Diamond MD [facility] 02/10/2023, 3:44 PM  CC: Mort Sawyers, FNP

## 2023-02-10 NOTE — Addendum Note (Signed)
Addended by: Lanna Poche on: 02/10/2023 04:37 PM   Modules accepted: Orders

## 2023-02-17 ENCOUNTER — Other Ambulatory Visit: Payer: Self-pay | Admitting: Obstetrics and Gynecology

## 2023-02-17 DIAGNOSIS — N6310 Unspecified lump in the right breast, unspecified quadrant: Secondary | ICD-10-CM

## 2023-02-18 LAB — ALPHA-1 ANTITRYPSIN PHENOTYPE: A-1 Antitrypsin, Ser: 187 mg/dL (ref 83–199)

## 2023-02-24 ENCOUNTER — Encounter: Payer: Self-pay | Admitting: Pulmonary Disease

## 2023-03-01 ENCOUNTER — Other Ambulatory Visit: Payer: Self-pay | Admitting: Family

## 2023-03-01 DIAGNOSIS — E782 Mixed hyperlipidemia: Secondary | ICD-10-CM

## 2023-03-20 ENCOUNTER — Other Ambulatory Visit: Payer: Self-pay | Admitting: Obstetrics and Gynecology

## 2023-03-20 ENCOUNTER — Ambulatory Visit
Admission: RE | Admit: 2023-03-20 | Discharge: 2023-03-20 | Disposition: A | Discharge status: Home / Self Care | Payer: BC Managed Care – PPO | Source: Ambulatory Visit | Attending: Obstetrics and Gynecology | Admitting: Obstetrics and Gynecology

## 2023-03-20 DIAGNOSIS — N6315 Unspecified lump in the right breast, overlapping quadrants: Secondary | ICD-10-CM | POA: Diagnosis not present

## 2023-03-20 DIAGNOSIS — N6310 Unspecified lump in the right breast, unspecified quadrant: Secondary | ICD-10-CM

## 2023-03-22 DIAGNOSIS — N3001 Acute cystitis with hematuria: Secondary | ICD-10-CM | POA: Diagnosis not present

## 2023-03-25 ENCOUNTER — Other Ambulatory Visit: Payer: BC Managed Care – PPO

## 2023-04-11 ENCOUNTER — Ambulatory Visit
Admission: RE | Admit: 2023-04-11 | Discharge: 2023-04-11 | Disposition: A | Discharge status: Home / Self Care | Payer: BC Managed Care – PPO | Source: Ambulatory Visit | Attending: Pulmonary Disease | Admitting: Pulmonary Disease

## 2023-04-11 DIAGNOSIS — J439 Emphysema, unspecified: Secondary | ICD-10-CM | POA: Diagnosis not present

## 2023-04-11 DIAGNOSIS — J432 Centrilobular emphysema: Secondary | ICD-10-CM

## 2023-06-09 ENCOUNTER — Other Ambulatory Visit: Payer: Self-pay | Admitting: Family

## 2023-06-09 DIAGNOSIS — G479 Sleep disorder, unspecified: Secondary | ICD-10-CM

## 2023-07-11 ENCOUNTER — Encounter: Payer: Self-pay | Admitting: Pulmonary Disease

## 2023-08-01 ENCOUNTER — Other Ambulatory Visit: Payer: Self-pay | Admitting: Family

## 2023-08-01 DIAGNOSIS — R12 Heartburn: Secondary | ICD-10-CM

## 2023-12-04 ENCOUNTER — Other Ambulatory Visit: Payer: Self-pay | Admitting: Family

## 2023-12-04 DIAGNOSIS — E782 Mixed hyperlipidemia: Secondary | ICD-10-CM

## 2024-04-05 DIAGNOSIS — Z3040 Encounter for surveillance of contraceptives, unspecified: Secondary | ICD-10-CM | POA: Diagnosis not present

## 2024-04-05 DIAGNOSIS — D069 Carcinoma in situ of cervix, unspecified: Secondary | ICD-10-CM | POA: Diagnosis not present

## 2024-04-05 DIAGNOSIS — E559 Vitamin D deficiency, unspecified: Secondary | ICD-10-CM | POA: Diagnosis not present

## 2024-04-05 DIAGNOSIS — Z139 Encounter for screening, unspecified: Secondary | ICD-10-CM | POA: Diagnosis not present

## 2024-04-05 DIAGNOSIS — Z1231 Encounter for screening mammogram for malignant neoplasm of breast: Secondary | ICD-10-CM | POA: Diagnosis not present

## 2024-04-05 DIAGNOSIS — Z01419 Encounter for gynecological examination (general) (routine) without abnormal findings: Secondary | ICD-10-CM | POA: Diagnosis not present

## 2024-04-19 DIAGNOSIS — E782 Mixed hyperlipidemia: Secondary | ICD-10-CM | POA: Diagnosis not present

## 2024-05-28 DIAGNOSIS — I10 Essential (primary) hypertension: Secondary | ICD-10-CM | POA: Diagnosis not present

## 2024-06-10 ENCOUNTER — Other Ambulatory Visit: Payer: Self-pay | Admitting: Obstetrics and Gynecology

## 2024-06-10 DIAGNOSIS — Z9889 Other specified postprocedural states: Secondary | ICD-10-CM | POA: Diagnosis not present

## 2024-06-10 DIAGNOSIS — N870 Mild cervical dysplasia: Secondary | ICD-10-CM | POA: Diagnosis not present

## 2024-06-10 DIAGNOSIS — R87612 Low grade squamous intraepithelial lesion on cytologic smear of cervix (LGSIL): Secondary | ICD-10-CM | POA: Diagnosis not present

## 2024-06-10 DIAGNOSIS — N879 Dysplasia of cervix uteri, unspecified: Secondary | ICD-10-CM | POA: Diagnosis not present

## 2024-06-18 LAB — SURGICAL PATHOLOGY

## 2024-06-23 DIAGNOSIS — N870 Mild cervical dysplasia: Secondary | ICD-10-CM | POA: Diagnosis not present

## 2024-07-01 DIAGNOSIS — E782 Mixed hyperlipidemia: Secondary | ICD-10-CM | POA: Diagnosis not present

## 2024-07-27 DIAGNOSIS — I10 Essential (primary) hypertension: Secondary | ICD-10-CM | POA: Diagnosis not present

## 2024-08-02 DIAGNOSIS — E782 Mixed hyperlipidemia: Secondary | ICD-10-CM | POA: Diagnosis not present

## 2024-08-02 DIAGNOSIS — Z131 Encounter for screening for diabetes mellitus: Secondary | ICD-10-CM | POA: Diagnosis not present

## 2024-08-27 DIAGNOSIS — I10 Essential (primary) hypertension: Secondary | ICD-10-CM | POA: Diagnosis not present

## 2024-10-01 DIAGNOSIS — I10 Essential (primary) hypertension: Secondary | ICD-10-CM | POA: Diagnosis not present
